# Patient Record
Sex: Female | Born: 1980
Health system: Southern US, Community
[De-identification: ages and names within clinical notes are randomized; demographics above are authoritative.]

## PROBLEM LIST (undated history)

## (undated) DIAGNOSIS — J189 Pneumonia, unspecified organism: Secondary | ICD-10-CM

## (undated) DIAGNOSIS — F4310 Post-traumatic stress disorder, unspecified: Secondary | ICD-10-CM

## (undated) DIAGNOSIS — Z7289 Other problems related to lifestyle: Secondary | ICD-10-CM

## (undated) DIAGNOSIS — M797 Fibromyalgia: Secondary | ICD-10-CM

## (undated) DIAGNOSIS — N809 Endometriosis, unspecified: Secondary | ICD-10-CM

## (undated) DIAGNOSIS — B059 Measles without complication: Secondary | ICD-10-CM

## (undated) DIAGNOSIS — D352 Benign neoplasm of pituitary gland: Secondary | ICD-10-CM

## (undated) DIAGNOSIS — B019 Varicella without complication: Secondary | ICD-10-CM

## (undated) DIAGNOSIS — F603 Borderline personality disorder: Secondary | ICD-10-CM

## (undated) DIAGNOSIS — F419 Anxiety disorder, unspecified: Secondary | ICD-10-CM

## (undated) HISTORY — PX: MULTIPLE TOOTH EXTRACTIONS: SHX2053

## (undated) HISTORY — PX: CHOLECYSTECTOMY: SHX55

## (undated) HISTORY — PX: CARPAL TUNNEL RELEASE: SHX101

## (undated) HISTORY — PX: TONSILLECTOMY: SUR1361

## (undated) HISTORY — PX: ULNAR NERVE REPAIR: SHX2594

---

## 2016-10-03 ENCOUNTER — Emergency Department (HOSPITAL_COMMUNITY): Payer: Self-pay

## 2016-10-03 ENCOUNTER — Inpatient Hospital Stay (HOSPITAL_COMMUNITY)
Admission: EM | Admit: 2016-10-03 | Discharge: 2016-10-06 | DRG: 439 | Discharge status: Against Medical Advice | Payer: Self-pay | Attending: Family Medicine | Admitting: Family Medicine

## 2016-10-03 ENCOUNTER — Encounter (HOSPITAL_COMMUNITY): Payer: Self-pay | Admitting: Emergency Medicine

## 2016-10-03 DIAGNOSIS — Z915 Personal history of self-harm: Secondary | ICD-10-CM

## 2016-10-03 DIAGNOSIS — F10920 Alcohol use, unspecified with intoxication, uncomplicated: Secondary | ICD-10-CM

## 2016-10-03 DIAGNOSIS — K860 Alcohol-induced chronic pancreatitis: Principal | ICD-10-CM | POA: Diagnosis present

## 2016-10-03 DIAGNOSIS — M797 Fibromyalgia: Secondary | ICD-10-CM | POA: Diagnosis present

## 2016-10-03 DIAGNOSIS — R748 Abnormal levels of other serum enzymes: Secondary | ICD-10-CM | POA: Diagnosis present

## 2016-10-03 DIAGNOSIS — F10129 Alcohol abuse with intoxication, unspecified: Secondary | ICD-10-CM

## 2016-10-03 DIAGNOSIS — K2920 Alcoholic gastritis without bleeding: Secondary | ICD-10-CM | POA: Diagnosis present

## 2016-10-03 DIAGNOSIS — F10221 Alcohol dependence with intoxication delirium: Secondary | ICD-10-CM | POA: Diagnosis present

## 2016-10-03 DIAGNOSIS — F1423 Cocaine dependence with withdrawal: Secondary | ICD-10-CM | POA: Diagnosis present

## 2016-10-03 DIAGNOSIS — F319 Bipolar disorder, unspecified: Secondary | ICD-10-CM | POA: Diagnosis present

## 2016-10-03 DIAGNOSIS — Z79890 Hormone replacement therapy (postmenopausal): Secondary | ICD-10-CM

## 2016-10-03 DIAGNOSIS — K852 Alcohol induced acute pancreatitis without necrosis or infection: Secondary | ICD-10-CM

## 2016-10-03 DIAGNOSIS — R45851 Suicidal ideations: Secondary | ICD-10-CM | POA: Diagnosis present

## 2016-10-03 DIAGNOSIS — F17210 Nicotine dependence, cigarettes, uncomplicated: Secondary | ICD-10-CM | POA: Diagnosis present

## 2016-10-03 DIAGNOSIS — R079 Chest pain, unspecified: Secondary | ICD-10-CM | POA: Diagnosis present

## 2016-10-03 DIAGNOSIS — F1110 Opioid abuse, uncomplicated: Secondary | ICD-10-CM | POA: Diagnosis present

## 2016-10-03 DIAGNOSIS — I10 Essential (primary) hypertension: Secondary | ICD-10-CM | POA: Diagnosis present

## 2016-10-03 DIAGNOSIS — F4310 Post-traumatic stress disorder, unspecified: Secondary | ICD-10-CM | POA: Diagnosis present

## 2016-10-03 DIAGNOSIS — G8929 Other chronic pain: Secondary | ICD-10-CM | POA: Diagnosis present

## 2016-10-03 DIAGNOSIS — F603 Borderline personality disorder: Secondary | ICD-10-CM | POA: Diagnosis present

## 2016-10-03 DIAGNOSIS — K5090 Crohn's disease, unspecified, without complications: Secondary | ICD-10-CM | POA: Diagnosis present

## 2016-10-03 DIAGNOSIS — Z91018 Allergy to other foods: Secondary | ICD-10-CM

## 2016-10-03 DIAGNOSIS — F410 Panic disorder [episodic paroxysmal anxiety] without agoraphobia: Secondary | ICD-10-CM | POA: Diagnosis present

## 2016-10-03 DIAGNOSIS — Y906 Blood alcohol level of 120-199 mg/100 ml: Secondary | ICD-10-CM | POA: Diagnosis present

## 2016-10-03 HISTORY — DX: Varicella without complication: B01.9

## 2016-10-03 HISTORY — DX: Anxiety disorder, unspecified: F41.9

## 2016-10-03 HISTORY — DX: Fibromyalgia: M79.7

## 2016-10-03 HISTORY — DX: Other problems related to lifestyle: Z72.89

## 2016-10-03 HISTORY — DX: Post-traumatic stress disorder, unspecified: F43.10

## 2016-10-03 HISTORY — DX: Borderline personality disorder: F60.3

## 2016-10-03 HISTORY — DX: Measles without complication: B05.9

## 2016-10-03 HISTORY — DX: Benign neoplasm of pituitary gland: D35.2

## 2016-10-03 HISTORY — DX: Endometriosis, unspecified: N80.9

## 2016-10-03 HISTORY — DX: Pneumonia, unspecified organism: J18.9

## 2016-10-03 LAB — COMPREHENSIVE METABOLIC PANEL
ALT: 33 U/L (ref 14–54)
AST: 45 U/L — AB (ref 15–41)
Albumin: 4.5 g/dL (ref 3.5–5.0)
Alkaline Phosphatase: 106 U/L (ref 38–126)
Anion gap: 11 (ref 5–15)
BILIRUBIN TOTAL: 0.7 mg/dL (ref 0.3–1.2)
CO2: 30 mmol/L (ref 22–32)
CREATININE: 0.64 mg/dL (ref 0.44–1.00)
Calcium: 9.2 mg/dL (ref 8.9–10.3)
Chloride: 104 mmol/L (ref 101–111)
GFR calc Af Amer: >60 mL/min (ref >60)
Glucose, Bld: 89 mg/dL (ref 65–99)
Potassium: 3.2 mmol/L — ABNORMAL LOW (ref 3.5–5.1)
Sodium: 145 mmol/L (ref 135–145)
TOTAL PROTEIN: 7.9 g/dL (ref 6.5–8.1)

## 2016-10-03 LAB — CBC WITH DIFFERENTIAL/PLATELET
BASOS ABS: 0 10*3/uL (ref 0.0–0.1)
Basophils Relative: 1 %
Eosinophils Absolute: 0.2 10*3/uL (ref 0.0–0.7)
Eosinophils Relative: 4 %
HEMATOCRIT: 38.4 % (ref 36.0–46.0)
HEMOGLOBIN: 13.9 g/dL (ref 12.0–15.0)
LYMPHS PCT: 31 %
Lymphs Abs: 1.5 10*3/uL (ref 0.7–4.0)
MCH: 32 pg (ref 26.0–34.0)
MCHC: 36.2 g/dL — ABNORMAL HIGH (ref 30.0–36.0)
MCV: 88.3 fL (ref 78.0–100.0)
MONO ABS: 0.3 10*3/uL (ref 0.1–1.0)
Monocytes Relative: 6 %
NEUTROS ABS: 2.8 10*3/uL (ref 1.7–7.7)
Neutrophils Relative %: 58 %
Platelets: 146 10*3/uL — ABNORMAL LOW (ref 150–400)
RBC: 4.35 MIL/uL (ref 3.87–5.11)
RDW: 13.1 % (ref 11.5–15.5)
WBC: 4.8 10*3/uL (ref 4.0–10.5)

## 2016-10-03 LAB — I-STAT BETA HCG BLOOD, ED (MC, WL, AP ONLY)

## 2016-10-03 LAB — RAPID URINE DRUG SCREEN, HOSP PERFORMED
Amphetamines: NOT DETECTED
BENZODIAZEPINES: POSITIVE — AB
Barbiturates: NOT DETECTED
Cocaine: POSITIVE — AB
OPIATES: POSITIVE — AB
TETRAHYDROCANNABINOL: NOT DETECTED

## 2016-10-03 LAB — URINALYSIS, ROUTINE W REFLEX MICROSCOPIC
BILIRUBIN URINE: NEGATIVE
Glucose, UA: NEGATIVE mg/dL
HGB URINE DIPSTICK: NEGATIVE
Ketones, ur: NEGATIVE mg/dL
Leukocytes, UA: NEGATIVE
NITRITE: NEGATIVE
Protein, ur: NEGATIVE mg/dL
SPECIFIC GRAVITY, URINE: 1.003 — AB (ref 1.005–1.030)
pH: 7 (ref 5.0–8.0)

## 2016-10-03 LAB — TROPONIN I

## 2016-10-03 LAB — LIPASE, BLOOD: Lipase: 76 U/L — ABNORMAL HIGH (ref 11–51)

## 2016-10-03 LAB — ETHANOL: Alcohol, Ethyl (B): 166 mg/dL — ABNORMAL HIGH (ref <5)

## 2016-10-03 MED ORDER — IOPAMIDOL (ISOVUE-300) INJECTION 61%
30.0000 mL | Freq: Once | INTRAVENOUS | Status: AC | PRN
  Administered 2016-10-03: 30 mL via ORAL

## 2016-10-03 MED ORDER — SODIUM CHLORIDE 0.9 % IV SOLN
INTRAVENOUS | Status: DC
  Administered 2016-10-03 – 2016-10-04 (×3): via INTRAVENOUS

## 2016-10-03 MED ORDER — LORAZEPAM 1 MG PO TABS
1.0000 mg | ORAL_TABLET | ORAL | Status: DC | PRN
Start: 2016-10-04 — End: 2016-10-06
  Administered 2016-10-04 – 2016-10-05 (×5): 1 mg via ORAL
  Filled 2016-10-03 (×5): qty 1

## 2016-10-03 MED ORDER — SODIUM CHLORIDE 0.9% FLUSH
3.0000 mL | Freq: Two times a day (BID) | INTRAVENOUS | Status: DC
  Administered 2016-10-03 – 2016-10-06 (×4): 3 mL via INTRAVENOUS

## 2016-10-03 MED ORDER — LORAZEPAM 2 MG/ML IJ SOLN
1.0000 mg | Freq: Four times a day (QID) | INTRAMUSCULAR | Status: DC | PRN
  Administered 2016-10-03: 1 mg via INTRAVENOUS
  Filled 2016-10-03 (×2): qty 1

## 2016-10-03 MED ORDER — IOPAMIDOL (ISOVUE-300) INJECTION 61%
INTRAVENOUS | Status: AC
  Administered 2016-10-03: 30 mL via ORAL
  Filled 2016-10-03: qty 30

## 2016-10-03 MED ORDER — PANTOPRAZOLE SODIUM 40 MG IV SOLR
40.0000 mg | Freq: Two times a day (BID) | INTRAVENOUS | Status: DC
  Administered 2016-10-03 – 2016-10-04 (×2): 40 mg via INTRAVENOUS
  Filled 2016-10-03 (×2): qty 40

## 2016-10-03 MED ORDER — HALOPERIDOL LACTATE 5 MG/ML IJ SOLN
2.0000 mg | Freq: Once | INTRAMUSCULAR | Status: DC
Start: 2016-10-03 — End: 2016-10-03
  Filled 2016-10-03: qty 1

## 2016-10-03 MED ORDER — LORAZEPAM 2 MG/ML IJ SOLN
1.0000 mg | Freq: Once | INTRAMUSCULAR | Status: AC
  Administered 2016-10-03: 1 mg via INTRAVENOUS
  Filled 2016-10-03: qty 1

## 2016-10-03 MED ORDER — DIPHENHYDRAMINE HCL 50 MG/ML IJ SOLN
12.5000 mg | Freq: Once | INTRAMUSCULAR | Status: AC
  Administered 2016-10-03: 12.5 mg via INTRAVENOUS
  Filled 2016-10-03: qty 1

## 2016-10-03 MED ORDER — LORAZEPAM 2 MG/ML IJ SOLN
1.0000 mg | INTRAMUSCULAR | Status: DC | PRN
  Administered 2016-10-03 – 2016-10-06 (×9): 1 mg via INTRAVENOUS
  Filled 2016-10-03 (×9): qty 1

## 2016-10-03 MED ORDER — SODIUM CHLORIDE 0.9 % IV BOLUS (SEPSIS)
1000.0000 mL | Freq: Once | INTRAVENOUS | Status: AC
  Administered 2016-10-03: 1000 mL via INTRAVENOUS

## 2016-10-03 MED ORDER — MORPHINE SULFATE (PF) 2 MG/ML IV SOLN
4.0000 mg | Freq: Once | INTRAVENOUS | Status: AC
  Administered 2016-10-03: 4 mg via INTRAVENOUS
  Filled 2016-10-03: qty 2

## 2016-10-03 MED ORDER — KETOROLAC TROMETHAMINE 30 MG/ML IJ SOLN
30.0000 mg | Freq: Once | INTRAMUSCULAR | Status: AC
  Administered 2016-10-03: 30 mg via INTRAVENOUS
  Filled 2016-10-03: qty 1

## 2016-10-03 MED ORDER — LORAZEPAM 2 MG/ML IJ SOLN: 1.0000 mg | Freq: Four times a day (QID) | INTRAMUSCULAR | Status: DC | PRN

## 2016-10-03 MED ORDER — LORAZEPAM 1 MG PO TABS: 1.0000 mg | ORAL_TABLET | ORAL | Status: DC | PRN

## 2016-10-03 MED ORDER — METOCLOPRAMIDE HCL 5 MG/ML IJ SOLN
10.0000 mg | Freq: Once | INTRAMUSCULAR | Status: DC
  Filled 2016-10-03: qty 2

## 2016-10-03 MED ORDER — FOLIC ACID 1 MG PO TABS
1.0000 mg | ORAL_TABLET | Freq: Every day | ORAL | Status: DC
  Administered 2016-10-04 – 2016-10-06 (×3): 1 mg via ORAL
  Filled 2016-10-03 (×3): qty 1

## 2016-10-03 MED ORDER — IOPAMIDOL (ISOVUE-300) INJECTION 61%
INTRAVENOUS | Status: AC
  Filled 2016-10-03: qty 100

## 2016-10-03 MED ORDER — ADULT MULTIVITAMIN W/MINERALS CH
1.0000 | ORAL_TABLET | Freq: Every day | ORAL | Status: DC
  Administered 2016-10-04 – 2016-10-06 (×3): 1 via ORAL
  Filled 2016-10-03 (×3): qty 1

## 2016-10-03 MED ORDER — THIAMINE HCL 100 MG/ML IJ SOLN: 100.0000 mg | Freq: Every day | INTRAMUSCULAR | Status: DC

## 2016-10-03 MED ORDER — MORPHINE SULFATE (PF) 2 MG/ML IV SOLN
4.0000 mg | INTRAVENOUS | Status: DC | PRN
  Administered 2016-10-03 – 2016-10-04 (×4): 4 mg via INTRAVENOUS
  Filled 2016-10-03 (×4): qty 2

## 2016-10-03 MED ORDER — LORAZEPAM 1 MG PO TABS: 1.0000 mg | ORAL_TABLET | Freq: Four times a day (QID) | ORAL | Status: DC | PRN

## 2016-10-03 MED ORDER — MAGNESIUM SULFATE 2 GM/50ML IV SOLN
2.0000 g | Freq: Once | INTRAVENOUS | Status: AC
  Administered 2016-10-03: 2 g via INTRAVENOUS
  Filled 2016-10-03: qty 50

## 2016-10-03 MED ORDER — POTASSIUM CHLORIDE CRYS ER 20 MEQ PO TBCR
40.0000 meq | EXTENDED_RELEASE_TABLET | Freq: Once | ORAL | Status: AC
  Administered 2016-10-03: 40 meq via ORAL
  Filled 2016-10-03: qty 2

## 2016-10-03 MED ORDER — ONDANSETRON HCL 4 MG PO TABS
4.0000 mg | ORAL_TABLET | Freq: Four times a day (QID) | ORAL | Status: DC | PRN
  Administered 2016-10-04 – 2016-10-05 (×2): 4 mg via ORAL
  Filled 2016-10-03 (×2): qty 1

## 2016-10-03 MED ORDER — VITAMIN B-1 100 MG PO TABS
100.0000 mg | ORAL_TABLET | Freq: Every day | ORAL | Status: DC
  Administered 2016-10-04 – 2016-10-06 (×3): 100 mg via ORAL
  Filled 2016-10-03 (×3): qty 1

## 2016-10-03 MED ORDER — CHLORDIAZEPOXIDE HCL 10 MG PO CAPS
10.0000 mg | ORAL_CAPSULE | Freq: Three times a day (TID) | ORAL | Status: DC
  Administered 2016-10-03 – 2016-10-04 (×2): 10 mg via ORAL
  Filled 2016-10-03: qty 2
  Filled 2016-10-03 (×2): qty 1

## 2016-10-03 MED ORDER — ENOXAPARIN SODIUM 40 MG/0.4ML ~~LOC~~ SOLN
40.0000 mg | SUBCUTANEOUS | Status: DC
  Administered 2016-10-03 – 2016-10-05 (×3): 40 mg via SUBCUTANEOUS
  Filled 2016-10-03 (×3): qty 0.4

## 2016-10-03 MED ORDER — IOPAMIDOL (ISOVUE-300) INJECTION 61%
100.0000 mL | Freq: Once | INTRAVENOUS | Status: AC | PRN
  Administered 2016-10-03: 100 mL via INTRAVENOUS

## 2016-10-03 MED ORDER — ONDANSETRON HCL 4 MG/2ML IJ SOLN
4.0000 mg | Freq: Four times a day (QID) | INTRAMUSCULAR | Status: DC | PRN
  Administered 2016-10-05 – 2016-10-06 (×2): 4 mg via INTRAVENOUS
  Filled 2016-10-03 (×2): qty 2

## 2016-10-03 MED ORDER — THIAMINE HCL 100 MG/ML IJ SOLN
Freq: Once | INTRAVENOUS | Status: AC
  Administered 2016-10-03: 17:00:00 via INTRAVENOUS
  Filled 2016-10-03: qty 1000

## 2016-10-03 NOTE — ED Triage Notes (Signed)
Patient is complaining of abdominal pain associated with N/V. Patient has a history of pancreatitis.  Patient is from [place].       BP:  133/101 HR:110 R:20  CBG: 128

## 2016-10-03 NOTE — ED Notes (Signed)
Pt reminded we need a urine sample. Unable to provide at this time.  Pt also provided with a phone because she states she wants to call someone to come get her so she can get some alcohol.
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
## 2016-10-03 NOTE — ED Notes (Signed)
Bed: WA08 Expected date:  Expected time:  Means of arrival:  Comments: EMS n/v

## 2016-10-03 NOTE — ED Notes (Signed)
Upon walking into the room, pt immediately started yelling, saying she's "being discriminated against" because she has not received "opiate pain medicine". Pt states that her "tolerance is very high and she needs a higher dose" & that if she is not going to be given pain medicine, she will leave to go get alcohol and drink the pain away.   RN made MD aware. RN also tried to explain that she is not being discriminated against & that reglan was offered to her earlier, which she refused. RN also explained that the fluids she's receiving are part of the treatment.
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
## 2016-10-04 DIAGNOSIS — F17210 Nicotine dependence, cigarettes, uncomplicated: Secondary | ICD-10-CM

## 2016-10-04 DIAGNOSIS — F1120 Opioid dependence, uncomplicated: Secondary | ICD-10-CM

## 2016-10-04 DIAGNOSIS — K859 Acute pancreatitis without necrosis or infection, unspecified: Secondary | ICD-10-CM

## 2016-10-04 DIAGNOSIS — F1410 Cocaine abuse, uncomplicated: Secondary | ICD-10-CM

## 2016-10-04 DIAGNOSIS — K2920 Alcoholic gastritis without bleeding: Secondary | ICD-10-CM

## 2016-10-04 DIAGNOSIS — F603 Borderline personality disorder: Secondary | ICD-10-CM

## 2016-10-04 DIAGNOSIS — F1920 Other psychoactive substance dependence, uncomplicated: Secondary | ICD-10-CM

## 2016-10-04 LAB — COMPREHENSIVE METABOLIC PANEL
ALK PHOS: 69 U/L (ref 38–126)
ALT: 23 U/L (ref 14–54)
AST: 31 U/L (ref 15–41)
Albumin: 3.2 g/dL — ABNORMAL LOW (ref 3.5–5.0)
Anion gap: 5 (ref 5–15)
BILIRUBIN TOTAL: 1.1 mg/dL (ref 0.3–1.2)
BUN: <5 mg/dL — ABNORMAL LOW (ref 6–20)
CALCIUM: 7.8 mg/dL — AB (ref 8.9–10.3)
CO2: 25 mmol/L (ref 22–32)
Chloride: 108 mmol/L (ref 101–111)
Creatinine, Ser: 0.63 mg/dL (ref 0.44–1.00)
GLUCOSE: 83 mg/dL (ref 65–99)
POTASSIUM: 3.6 mmol/L (ref 3.5–5.1)
Sodium: 138 mmol/L (ref 135–145)
TOTAL PROTEIN: 5.7 g/dL — AB (ref 6.5–8.1)

## 2016-10-04 LAB — CBC
HCT: 29.2 % — ABNORMAL LOW (ref 36.0–46.0)
HEMOGLOBIN: 10.4 g/dL — AB (ref 12.0–15.0)
MCH: 32.4 pg (ref 26.0–34.0)
MCHC: 35.6 g/dL (ref 30.0–36.0)
MCV: 91 fL (ref 78.0–100.0)
PLATELETS: 91 10*3/uL — AB (ref 150–400)
RBC: 3.21 MIL/uL — AB (ref 3.87–5.11)
RDW: 13.4 % (ref 11.5–15.5)
WBC: 4.5 10*3/uL (ref 4.0–10.5)

## 2016-10-04 LAB — TROPONIN I

## 2016-10-04 LAB — HIV ANTIBODY (ROUTINE TESTING W REFLEX): HIV SCREEN 4TH GENERATION: NONREACTIVE

## 2016-10-04 MED ORDER — CHLORDIAZEPOXIDE HCL 25 MG PO CAPS
50.0000 mg | ORAL_CAPSULE | Freq: Three times a day (TID) | ORAL | Status: AC
  Administered 2016-10-04 – 2016-10-05 (×3): 50 mg via ORAL
  Filled 2016-10-04 (×3): qty 2

## 2016-10-04 MED ORDER — GABAPENTIN 100 MG PO CAPS
100.0000 mg | ORAL_CAPSULE | Freq: Two times a day (BID) | ORAL | Status: DC
  Administered 2016-10-04 – 2016-10-06 (×4): 100 mg via ORAL
  Filled 2016-10-04 (×4): qty 1

## 2016-10-04 MED ORDER — FAMOTIDINE IN NACL 20-0.9 MG/50ML-% IV SOLN
20.0000 mg | Freq: Two times a day (BID) | INTRAVENOUS | Status: DC
  Administered 2016-10-04 – 2016-10-05 (×2): 20 mg via INTRAVENOUS
  Filled 2016-10-04 (×2): qty 50

## 2016-10-04 MED ORDER — OXYCODONE-ACETAMINOPHEN 5-325 MG PO TABS
2.0000 | ORAL_TABLET | Freq: Four times a day (QID) | ORAL | Status: DC | PRN
  Administered 2016-10-04 – 2016-10-05 (×4): 2 via ORAL
  Filled 2016-10-04 (×4): qty 2

## 2016-10-04 MED ORDER — CHLORDIAZEPOXIDE HCL 25 MG PO CAPS
25.0000 mg | ORAL_CAPSULE | Freq: Four times a day (QID) | ORAL | Status: DC
  Administered 2016-10-05 – 2016-10-06 (×4): 25 mg via ORAL
  Filled 2016-10-04 (×4): qty 1

## 2016-10-04 MED ORDER — MORPHINE SULFATE (PF) 2 MG/ML IV SOLN
4.0000 mg | INTRAVENOUS | Status: DC | PRN
  Administered 2016-10-04 (×2): 4 mg via INTRAVENOUS
  Filled 2016-10-04 (×2): qty 2
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
## 2016-10-05 ENCOUNTER — Encounter (HOSPITAL_COMMUNITY): Payer: Self-pay

## 2016-10-05 LAB — CBC WITH DIFFERENTIAL/PLATELET
BASOS ABS: 0 10*3/uL (ref 0.0–0.1)
BASOS PCT: 1 %
EOS PCT: 3 %
Eosinophils Absolute: 0.1 10*3/uL (ref 0.0–0.7)
HCT: 31.8 % — ABNORMAL LOW (ref 36.0–46.0)
Hemoglobin: 11.1 g/dL — ABNORMAL LOW (ref 12.0–15.0)
LYMPHS PCT: 42 %
Lymphs Abs: 1.6 10*3/uL (ref 0.7–4.0)
MCH: 32.5 pg (ref 26.0–34.0)
MCHC: 34.9 g/dL (ref 30.0–36.0)
MCV: 93 fL (ref 78.0–100.0)
MONO ABS: 0.4 10*3/uL (ref 0.1–1.0)
MONOS PCT: 9 %
Neutro Abs: 1.8 10*3/uL (ref 1.7–7.7)
Neutrophils Relative %: 45 %
PLATELETS: 126 10*3/uL — AB (ref 150–400)
RBC: 3.42 MIL/uL — ABNORMAL LOW (ref 3.87–5.11)
RDW: 13.5 % (ref 11.5–15.5)
WBC: 3.9 10*3/uL — ABNORMAL LOW (ref 4.0–10.5)

## 2016-10-05 LAB — BASIC METABOLIC PANEL
ANION GAP: 7 (ref 5–15)
CHLORIDE: 110 mmol/L (ref 101–111)
CO2: 23 mmol/L (ref 22–32)
Calcium: 8.7 mg/dL — ABNORMAL LOW (ref 8.9–10.3)
Creatinine, Ser: 0.62 mg/dL (ref 0.44–1.00)
Glucose, Bld: 94 mg/dL (ref 65–99)
POTASSIUM: 4.1 mmol/L (ref 3.5–5.1)
Sodium: 140 mmol/L (ref 135–145)

## 2016-10-05 LAB — LIPASE, BLOOD: LIPASE: 79 U/L — AB (ref 11–51)

## 2016-10-05 MED ORDER — LACTATED RINGERS IV SOLN
INTRAVENOUS | Status: DC
  Administered 2016-10-05 – 2016-10-06 (×2): via INTRAVENOUS

## 2016-10-05 MED ORDER — FAMOTIDINE 20 MG PO TABS
20.0000 mg | ORAL_TABLET | Freq: Two times a day (BID) | ORAL | Status: DC
  Administered 2016-10-05 – 2016-10-06 (×2): 20 mg via ORAL
  Filled 2016-10-05 (×2): qty 1

## 2016-10-05 MED ORDER — MORPHINE SULFATE (PF) 2 MG/ML IV SOLN
2.0000 mg | INTRAVENOUS | Status: DC | PRN
  Administered 2016-10-05 – 2016-10-06 (×6): 2 mg via INTRAVENOUS
  Filled 2016-10-05 (×6): qty 1
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
## 2016-10-05 NOTE — Progress Notes (Signed)
PHARMACIST - PHYSICIAN COMMUNICATION  DR:   Quincy Simmonds  CONCERNING: IV to Oral Route Change Policy  RECOMMENDATION: This patient is receiving Pepcid by the intravenous route.  Based on criteria approved by the Pharmacy and Therapeutics Committee, the intravenous medication(s) is/are being converted to the equivalent oral dose form(s).   DESCRIPTION: These criteria include:  The patient is eating (either orally or via tube) and/or has been taking other orally administered medications for a least 24 hours  The patient has no evidence of active gastrointestinal bleeding or impaired GI absorption (gastrectomy, short bowel, patient on TNA or NPO).  If you have questions about this conversion, please contact the Pharmacy Department  []   [phone] )  [facility] []   [phone] )  [facility] []   [phone] )  [facility] []   [phone] )  [facility] [x]   [phone] )  [place], [place], [facility] 10/05/2016 11:13 AM

## 2016-10-05 NOTE — Progress Notes (Signed)
Initial Nutrition Assessment  INTERVENTION:   Diet advancement per MD Once diet advanced: Provide Ensure Enlive po BID, each supplement provides 350 kcal and 20 grams of protein RD to continue to monitor  NUTRITION DIAGNOSIS:   Increased nutrient needs related to other (see comment) (acute pancreatitis) as evidenced by estimated needs.  GOAL:   Patient will meet greater than or equal to 90% of their needs  MONITOR:   PO intake, Supplement acceptance, Labs, Weight trends, I & O's  REASON FOR ASSESSMENT:   Malnutrition Screening Tool    ASSESSMENT:   36 y.o. female with medical history significant of alcohol abuse, cocaine tobacco abuse, presents to the emergency room with chief complaint of abdominal pain, nausea vomiting over the last 4-5 days.  Patient tells me that she has had an episode like this last week, she was diagnosed with acute pancreatitis and was hospitalized in [place].  She is currently visiting [place] with a friend.  She also tells me that she has been drinking heavily over the last few days, and last night she had a what sounds like a 1.75 L of vodka, and in addition 6 beers.  She states that she has a history of seizures in the setting of alcohol withdrawals and severe DTs.  She does not recall needing to be in ICU for her withdrawals.  She also is complaining of chest pain, and she states that over the last few months to year she has been having intermittent chest pain as well as chest pains and dyspnea on exertion with ambulation.  She states that when she was hospitalized in [place] she had a one-time fever, however has been afebrile since denies any chills.  She complains of significant nausea and vomiting and poor ability to take any p.o. intake, has not had anything to eat in the last 5 days.   Pt in room sleeping, RD unable to waken patient at this time. Per chart review, pt with history of polysubstance abuse (ETOH, cocaine). Pt reported  not eating much for 5 days PTA d/t N/V. Pt currently NPO.  Once diet advanced, will provide Ensure supplements d/t increased needs.  No weigh history available in EPIC. Nutrition focused physical exam shows no sign of depletion of muscle mass or body fat.  Labs reviewed. Medications: Folic acid tablet daily, Multivitamin with minerals daily, Thiamine tablet daily, Lactated ringers infusion, Zofran tablet PRN  Diet Order:  Diet NPO time specified  Skin:  Reviewed, no issues  Last BM:  4/6  Height:   Ht Readings from Last 1 Encounters:  10/03/16 5\' 1"  (1.549 m)    Weight:   Wt Readings from Last 1 Encounters:  10/03/16 110 lb (49.9 kg)    Ideal Body Weight:  47.7 kg  BMI:  Body mass index is 20.78 kg/m.  Estimated Nutritional Needs:   Kcal:  1500-1700  Protein:  80-90g  Fluid:  1.7L/day  EDUCATION NEEDS:   No education needs identified at this time  Clayton Bibles, MS, RD, LDN Pager: [phone] After Hours Pager: [phone]

## 2016-10-06 LAB — CBC
HEMATOCRIT: 33.1 % — AB (ref 36.0–46.0)
Hemoglobin: 11.7 g/dL — ABNORMAL LOW (ref 12.0–15.0)
MCH: 32.5 pg (ref 26.0–34.0)
MCHC: 35.3 g/dL (ref 30.0–36.0)
MCV: 91.9 fL (ref 78.0–100.0)
PLATELETS: 136 10*3/uL — AB (ref 150–400)
RBC: 3.6 MIL/uL — ABNORMAL LOW (ref 3.87–5.11)
RDW: 13.4 % (ref 11.5–15.5)
WBC: 3.8 10*3/uL — AB (ref 4.0–10.5)

## 2016-10-06 LAB — LIPASE, BLOOD: LIPASE: 32 U/L (ref 11–51)

## 2016-10-06 MED ORDER — KETOROLAC TROMETHAMINE 15 MG/ML IJ SOLN: 15.0000 mg | Freq: Four times a day (QID) | INTRAMUSCULAR | Status: DC | PRN

## 2016-10-06 MED ORDER — ONDANSETRON 4 MG PO TBDP: 4.0000 mg | ORAL_TABLET | Freq: Three times a day (TID) | ORAL | 0 refills | Status: DC | PRN

## 2016-10-06 MED ORDER — ONDANSETRON 4 MG PO TBDP: 4.0000 mg | ORAL_TABLET | Freq: Three times a day (TID) | ORAL | Status: DC | PRN

## 2016-10-06 MED ORDER — FAMOTIDINE 20 MG PO TABS: 20.0000 mg | ORAL_TABLET | Freq: Two times a day (BID) | ORAL | 0 refills | Status: DC

## 2016-10-06 MED ORDER — PAROXETINE HCL 20 MG PO TABS: 20.0000 mg | ORAL_TABLET | Freq: Every day | ORAL | 0 refills | Status: DC

## 2016-10-06 MED ORDER — THIAMINE HCL 100 MG PO TABS: 100.0000 mg | ORAL_TABLET | Freq: Every day | ORAL | 0 refills | Status: DC

## 2016-10-06 MED ORDER — PAROXETINE HCL 20 MG PO TABS
20.0000 mg | ORAL_TABLET | Freq: Every day | ORAL | Status: DC
  Filled 2016-10-06: qty 1

## 2016-10-06 MED ORDER — FOLIC ACID 1 MG PO TABS: 1.0000 mg | ORAL_TABLET | Freq: Every day | ORAL | 0 refills | Status: DC

## 2016-10-06 MED ORDER — MORPHINE SULFATE (PF) 4 MG/ML IV SOLN
2.0000 mg | INTRAVENOUS | Status: DC | PRN
  Administered 2016-10-06: 2 mg via INTRAVENOUS
  Filled 2016-10-06: qty 1

## 2016-10-06 NOTE — Progress Notes (Signed)
Patient agitated and irritable, refusing to wait on D/C paperwork. requesting to leave AMA. Form signed and patient escorted to elevator.

## 2016-10-06 NOTE — Progress Notes (Signed)
Chaplain visited patient while rounding.  Patient sitting in bed, crying and emoting regarding how she feels she is being treated by staff stating "they are treating me based on my past".  Patient talks about relationship with daughter, brothers and mother and expressing being worried about the outcome of her daughter today.  Patient expressing fear around daughter possibly being picked up by DSS if she doesn't get some paperwork signed.  Patient continues to express anger around pain, not receiving right protocol from staff and not receiving enough pain meds based on her knowledge of how she should be treated from web searches.   Patient states she believes in [facility] but that doesn't seem to help.  Patient does thank Chaplain for visit.    Chaplain will remain available if needed.    10/06/16 1058  Clinical Encounter Type  Visited With Patient  Visit Type Initial;Psychological support;Spiritual support;Social support;Behavioral Health  Spiritual Encounters  Spiritual Needs Emotional  Stress Factors  Patient Stress Factors Exhausted;Family relationships;Financial concerns;Health changes   Berneice Heinrich

## 2016-10-06 NOTE — Care Management Note (Signed)
Case Management Note  Patient Details  Name: Carrie Phillips MRN: [id number] Date of Birth: 10-04-1980  Subjective/Objective:  36 y/o f admitted w/polysubstance abuse. From home. Psych-providing counseling.                  Action/Plan:d/c plan home.   Expected Discharge Date:   (unknown)               Expected Discharge Plan:  Home/Self Care  In-House Referral:  Clinical Social Work  Discharge planning Services  CM Consult  Post Acute Care Choice:    Choice offered to:     DME Arranged:    DME Agency:     HH Arranged:    HH Agency:     Status of Service:  In process, will continue to follow  If discussed at Long Length of Stay Meetings, dates discussed:    Additional Comments:  Dessa Phi, RN 10/06/2016, 11:28 AM
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
## 2016-10-08 ENCOUNTER — Encounter (HOSPITAL_COMMUNITY): Payer: Self-pay | Admitting: Emergency Medicine

## 2016-10-08 ENCOUNTER — Emergency Department (HOSPITAL_COMMUNITY)
Admission: EM | Admit: 2016-10-08 | Discharge: 2016-10-08 | Disposition: A | Discharge status: Home / Self Care | Payer: Self-pay | Attending: Emergency Medicine | Admitting: Emergency Medicine

## 2016-10-08 DIAGNOSIS — F17210 Nicotine dependence, cigarettes, uncomplicated: Secondary | ICD-10-CM | POA: Insufficient documentation

## 2016-10-08 DIAGNOSIS — Z8719 Personal history of other diseases of the digestive system: Secondary | ICD-10-CM | POA: Insufficient documentation

## 2016-10-08 DIAGNOSIS — F1020 Alcohol dependence, uncomplicated: Secondary | ICD-10-CM | POA: Insufficient documentation

## 2016-10-08 LAB — COMPREHENSIVE METABOLIC PANEL
ALK PHOS: 79 U/L (ref 38–126)
ALT: 19 U/L (ref 14–54)
ANION GAP: 11 (ref 5–15)
AST: 25 U/L (ref 15–41)
Albumin: 4.1 g/dL (ref 3.5–5.0)
BUN: <5 mg/dL — ABNORMAL LOW (ref 6–20)
CALCIUM: 9.2 mg/dL (ref 8.9–10.3)
CO2: 26 mmol/L (ref 22–32)
Chloride: 103 mmol/L (ref 101–111)
Creatinine, Ser: 0.61 mg/dL (ref 0.44–1.00)
GFR calc non Af Amer: >60 mL/min (ref >60)
Glucose, Bld: 106 mg/dL — ABNORMAL HIGH (ref 65–99)
POTASSIUM: 3.6 mmol/L (ref 3.5–5.1)
SODIUM: 140 mmol/L (ref 135–145)
Total Bilirubin: 0.3 mg/dL (ref 0.3–1.2)
Total Protein: 6.9 g/dL (ref 6.5–8.1)

## 2016-10-08 LAB — CBC WITH DIFFERENTIAL/PLATELET
BASOS PCT: 0 %
Basophils Absolute: 0 10*3/uL (ref 0.0–0.1)
EOS ABS: 0 10*3/uL (ref 0.0–0.7)
EOS PCT: 1 %
HCT: 39.5 % (ref 36.0–46.0)
HEMOGLOBIN: 14.1 g/dL (ref 12.0–15.0)
LYMPHS ABS: 1.4 10*3/uL (ref 0.7–4.0)
LYMPHS PCT: 25 %
MCH: 32.6 pg (ref 26.0–34.0)
MCHC: 35.7 g/dL (ref 30.0–36.0)
MCV: 91.2 fL (ref 78.0–100.0)
MONO ABS: 0.4 10*3/uL (ref 0.1–1.0)
MONOS PCT: 7 %
NEUTROS ABS: 3.8 10*3/uL (ref 1.7–7.7)
NEUTROS PCT: 67 %
PLATELETS: 201 10*3/uL (ref 150–400)
RBC: 4.33 MIL/uL (ref 3.87–5.11)
RDW: 14.5 % (ref 11.5–15.5)
WBC: 5.6 10*3/uL (ref 4.0–10.5)

## 2016-10-08 LAB — ETHANOL

## 2016-10-08 LAB — LIPASE, BLOOD: Lipase: 45 U/L (ref 11–51)

## 2016-10-08 MED ORDER — LORAZEPAM 1 MG PO TABS
0.0000 mg | ORAL_TABLET | Freq: Four times a day (QID) | ORAL | Status: DC
Start: 2016-10-08 — End: 2016-10-08
  Administered 2016-10-08 (×2): 2 mg via ORAL
  Filled 2016-10-08 (×2): qty 2

## 2016-10-08 MED ORDER — ZOLPIDEM TARTRATE 5 MG PO TABS: 5.0000 mg | ORAL_TABLET | Freq: Every evening | ORAL | Status: DC | PRN

## 2016-10-08 MED ORDER — VITAMIN B-1 100 MG PO TABS
100.0000 mg | ORAL_TABLET | Freq: Every day | ORAL | Status: DC
  Administered 2016-10-08: 100 mg via ORAL
  Filled 2016-10-08: qty 1

## 2016-10-08 MED ORDER — LORAZEPAM 1 MG PO TABS: 0.0000 mg | ORAL_TABLET | Freq: Two times a day (BID) | ORAL | Status: DC

## 2016-10-08 MED ORDER — CHLORDIAZEPOXIDE HCL 25 MG PO CAPS: ORAL_CAPSULE | ORAL | 0 refills | Status: DC

## 2016-10-08 MED ORDER — OXYCODONE-ACETAMINOPHEN 5-325 MG PO TABS
2.0000 | ORAL_TABLET | Freq: Once | ORAL | Status: AC
  Administered 2016-10-08: 2 via ORAL
  Filled 2016-10-08: qty 2

## 2016-10-08 MED ORDER — NICOTINE 21 MG/24HR TD PT24
21.0000 mg | MEDICATED_PATCH | Freq: Every day | TRANSDERMAL | Status: DC | PRN
  Filled 2016-10-08: qty 1

## 2016-10-08 MED ORDER — THIAMINE HCL 100 MG/ML IJ SOLN: 100.0000 mg | Freq: Every day | INTRAMUSCULAR | Status: DC

## 2016-10-08 MED ORDER — ONDANSETRON 4 MG PO TBDP: 4.0000 mg | ORAL_TABLET | Freq: Three times a day (TID) | ORAL | 0 refills | Status: DC | PRN

## 2016-10-08 MED ORDER — OXYCODONE-ACETAMINOPHEN 5-325 MG PO TABS: 1.0000 | ORAL_TABLET | ORAL | 0 refills | Status: DC | PRN

## 2016-10-08 MED ORDER — OXYCODONE-ACETAMINOPHEN 5-325 MG PO TABS
1.0000 | ORAL_TABLET | Freq: Once | ORAL | Status: AC
  Administered 2016-10-08: 1 via ORAL
  Filled 2016-10-08: qty 1

## 2016-10-08 MED ORDER — ALUM & MAG HYDROXIDE-SIMETH 200-200-20 MG/5ML PO SUSP
30.0000 mL | ORAL | Status: DC | PRN
  Administered 2016-10-08: 30 mL via ORAL
  Filled 2016-10-08: qty 30

## 2016-10-08 MED ORDER — OXYCODONE-ACETAMINOPHEN 5-325 MG PO TABS: 1.0000 | ORAL_TABLET | Freq: Four times a day (QID) | ORAL | 0 refills | Status: DC | PRN

## 2016-10-08 MED ORDER — ONDANSETRON HCL 4 MG PO TABS
4.0000 mg | ORAL_TABLET | Freq: Three times a day (TID) | ORAL | Status: DC | PRN
  Administered 2016-10-08: 4 mg via ORAL
  Filled 2016-10-08: qty 1

## 2016-10-08 MED ORDER — IBUPROFEN 400 MG PO TABS
600.0000 mg | ORAL_TABLET | Freq: Three times a day (TID) | ORAL | Status: DC | PRN
  Administered 2016-10-08: 600 mg via ORAL
  Filled 2016-10-08: qty 1

## 2016-10-08 MED ORDER — PROMETHAZINE HCL 25 MG PO TABS
25.0000 mg | ORAL_TABLET | Freq: Once | ORAL | Status: AC
  Administered 2016-10-08: 25 mg via ORAL
  Filled 2016-10-08: qty 1

## 2016-10-08 NOTE — ED Provider Notes (Signed)
17:04- Patient interviewed. She is concerned about ongoing pancreatitis pain. She has been detoxed while IP, twice in last 2 weeks. She is worried about continuing to drink,''  Assessment-patient with anxiety, and alcoholism.  No apparent or impending significant alcohol withdrawal syndrome.  Is therefore stable for discharge with outpatient management.  Plan-Librium prescription to go     Daleen Bo, MD 10/11/16 2206

## 2016-10-08 NOTE — ED Notes (Signed)
Pt doing TTS. Is requesting pain meds for abdominal pain r/t hx of pancreatitis. Dr. Gilford Raid report she can have tylenol or motrin. Pt does not want either of those right now.

## 2016-10-08 NOTE — ED Notes (Signed)
Pt denied by [facility] and per [facility] is recommended for discharge. Notified dr Eulis Foster. Pt is awake and eating lunch.

## 2016-10-08 NOTE — ED Notes (Signed)
TTS called, reports they are ready for her assessment. Machine is in use then will take to her room.

## 2016-10-08 NOTE — ED Notes (Signed)
In to discharge patient, when she found out that she will not be sent to [facility] she reports she is suicidal and is going to hurt herself. "If I go home, I'm going to have to buy alcohol and will kill myself". PA made aware and went to bedside to talk with patient. PA reports [facility] will come talk with patient.
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
## 2016-10-08 NOTE — ED Notes (Signed)
Pt asking about detox services.  Pt sts she is concerned about having alcohol withdrawal seizures and is afraid to leave the hospital, stating she will start drinking again if she seizes, and she's trying to quit.

## 2016-10-08 NOTE — ED Notes (Signed)
TTS called and reports that she does not meet criteria for inpatient admission, but they do want to help her get treatment. Will need to fax her labs and information to [facility] [phone]. They need to review her information to make sure she is acceptable. They do have beds available.

## 2016-10-08 NOTE — ED Notes (Signed)
Faxed papers to [facility]
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
## 2016-10-08 NOTE — Discharge Instructions (Signed)
Stop drinking all forms of alcohol.  Go to one of the treatment centers for treatment.

## 2016-10-08 NOTE — ED Notes (Signed)
Pt wanded by security. 

## 2016-10-08 NOTE — BHH Counselor (Addendum)
Per Margarita Grizzle Pt does not meet mental health inpatient criteria. Recommends D/C.  This Probation officer contacted [facility]. [facility] has bed availabiilty. RN states she will fax the Pt's information to [facility]. [facility] states they will call the ED back to report if the Pt is accepted to their detox SA inpatient program.   Lorenza Cambridge, [facility] Triage Specialist

## 2016-10-08 NOTE — ED Notes (Signed)
Pt reports "I am not suicidal as long as you keep me here and get me treatment for detox. If you send me home, I will kill myself".

## 2016-10-08 NOTE — ED Triage Notes (Signed)
Pt arrives via [facility], left [facility] yesterday, was being treated for pancreatitis. Hx of alcohol abuse, drinks 1 liter of vodka/day, last drink 4/10 at 9am. Reports epigastric pain and nausea. Received 4mg  zofran pta. Pt a/ox4.

## 2016-10-13 ENCOUNTER — Emergency Department (HOSPITAL_COMMUNITY)
Admission: EM | Admit: 2016-10-13 | Discharge: 2016-10-15 | Disposition: A | Discharge status: Other Acute Inpatient Hospital | Payer: Self-pay | Attending: Emergency Medicine | Admitting: Emergency Medicine

## 2016-10-13 DIAGNOSIS — F332 Major depressive disorder, recurrent severe without psychotic features: Secondary | ICD-10-CM | POA: Insufficient documentation

## 2016-10-13 DIAGNOSIS — Z79899 Other long term (current) drug therapy: Secondary | ICD-10-CM | POA: Insufficient documentation

## 2016-10-13 DIAGNOSIS — F17210 Nicotine dependence, cigarettes, uncomplicated: Secondary | ICD-10-CM | POA: Insufficient documentation

## 2016-10-13 DIAGNOSIS — Y999 Unspecified external cause status: Secondary | ICD-10-CM | POA: Insufficient documentation

## 2016-10-13 DIAGNOSIS — Y92410 Unspecified street and highway as the place of occurrence of the external cause: Secondary | ICD-10-CM | POA: Insufficient documentation

## 2016-10-13 DIAGNOSIS — Y9301 Activity, walking, marching and hiking: Secondary | ICD-10-CM | POA: Insufficient documentation

## 2016-10-13 DIAGNOSIS — X58XXXA Exposure to other specified factors, initial encounter: Secondary | ICD-10-CM | POA: Insufficient documentation

## 2016-10-13 LAB — RAPID URINE DRUG SCREEN, HOSP PERFORMED
AMPHETAMINES: NOT DETECTED
BARBITURATES: NOT DETECTED
BENZODIAZEPINES: POSITIVE — AB
COCAINE: POSITIVE — AB
Opiates: NOT DETECTED
TETRAHYDROCANNABINOL: NOT DETECTED

## 2016-10-13 LAB — COMPREHENSIVE METABOLIC PANEL
ALBUMIN: 4.8 g/dL (ref 3.5–5.0)
ALT: 22 U/L (ref 14–54)
ANION GAP: 12 (ref 5–15)
AST: 44 U/L — AB (ref 15–41)
Alkaline Phosphatase: 79 U/L (ref 38–126)
BILIRUBIN TOTAL: 0.7 mg/dL (ref 0.3–1.2)
BUN: 6 mg/dL (ref 6–20)
CHLORIDE: 99 mmol/L — AB (ref 101–111)
CO2: 30 mmol/L (ref 22–32)
Calcium: 9.3 mg/dL (ref 8.9–10.3)
Creatinine, Ser: 0.78 mg/dL (ref 0.44–1.00)
GFR calc Af Amer: >60 mL/min (ref >60)
GFR calc non Af Amer: >60 mL/min (ref >60)
Glucose, Bld: 121 mg/dL — ABNORMAL HIGH (ref 65–99)
POTASSIUM: 3.6 mmol/L (ref 3.5–5.1)
SODIUM: 141 mmol/L (ref 135–145)
TOTAL PROTEIN: 8.1 g/dL (ref 6.5–8.1)

## 2016-10-13 LAB — CBC
HEMATOCRIT: 41.4 % (ref 36.0–46.0)
HEMOGLOBIN: 14.8 g/dL (ref 12.0–15.0)
MCH: 32 pg (ref 26.0–34.0)
MCHC: 35.7 g/dL (ref 30.0–36.0)
MCV: 89.6 fL (ref 78.0–100.0)
Platelets: 255 10*3/uL (ref 150–400)
RBC: 4.62 MIL/uL (ref 3.87–5.11)
RDW: 14.3 % (ref 11.5–15.5)
WBC: 6.2 10*3/uL (ref 4.0–10.5)

## 2016-10-13 LAB — ETHANOL: Alcohol, Ethyl (B): 215 mg/dL — ABNORMAL HIGH (ref <5)

## 2016-10-13 LAB — ACETAMINOPHEN LEVEL

## 2016-10-13 LAB — SALICYLATE LEVEL: Salicylate Lvl: <7 mg/dL (ref 2.8–30.0)

## 2016-10-13 MED ORDER — LORAZEPAM 2 MG/ML IJ SOLN: 0.0000 mg | Freq: Four times a day (QID) | INTRAMUSCULAR | Status: DC

## 2016-10-13 MED ORDER — ACETAMINOPHEN 325 MG PO TABS: 650.0000 mg | ORAL_TABLET | ORAL | Status: DC | PRN

## 2016-10-13 MED ORDER — ONDANSETRON HCL 4 MG PO TABS
4.0000 mg | ORAL_TABLET | Freq: Three times a day (TID) | ORAL | Status: DC | PRN
  Administered 2016-10-13: 4 mg via ORAL
  Filled 2016-10-13: qty 1

## 2016-10-13 MED ORDER — IBUPROFEN 200 MG PO TABS: 600.0000 mg | ORAL_TABLET | Freq: Three times a day (TID) | ORAL | Status: DC | PRN

## 2016-10-13 MED ORDER — NICOTINE 21 MG/24HR TD PT24
21.0000 mg | MEDICATED_PATCH | Freq: Every day | TRANSDERMAL | Status: DC
  Administered 2016-10-14: 21 mg via TRANSDERMAL
  Filled 2016-10-13 (×2): qty 1

## 2016-10-13 MED ORDER — ALUM & MAG HYDROXIDE-SIMETH 200-200-20 MG/5ML PO SUSP: 30.0000 mL | ORAL | Status: DC | PRN

## 2016-10-13 MED ORDER — LORAZEPAM 2 MG/ML IJ SOLN: 0.0000 mg | Freq: Two times a day (BID) | INTRAMUSCULAR | Status: DC

## 2016-10-13 MED ORDER — ZOLPIDEM TARTRATE 5 MG PO TABS: 5.0000 mg | ORAL_TABLET | Freq: Every evening | ORAL | Status: DC | PRN

## 2016-10-13 NOTE — ED Triage Notes (Signed)
Patient states she wants to kill herself by walking into car traffic. [employer] was called because some reported the patient standing in front of traffic lights. Patient states she wants to detox from alcohol.
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
## 2016-10-13 NOTE — ED Notes (Signed)
PA sts she will IVC patient

## 2016-10-13 NOTE — ED Notes (Signed)
Pt vomited several times; awaiting to give ativan at midnight due to excessive vomiting

## 2016-10-13 NOTE — ED Notes (Signed)
ED Provider at bedside. Yao 

## 2016-10-13 NOTE — ED Notes (Signed)
telepsych done
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
## 2016-10-13 NOTE — ED Notes (Signed)
Sane nurse at bedside 

## 2016-10-13 NOTE — ED Notes (Signed)
[employer] at bedside & sitter at bedside

## 2016-10-13 NOTE — ED Notes (Signed)
ED Provider at bedside. Abby, PA

## 2016-10-14 ENCOUNTER — Ambulatory Visit (HOSPITAL_COMMUNITY)
Admission: EM | Admit: 2016-10-14 | Discharge: 2016-10-14 | Disposition: A | Discharge status: Home / Self Care | Payer: No Typology Code available for payment source | Source: Ambulatory Visit | Attending: Emergency Medicine | Admitting: Emergency Medicine

## 2016-10-14 DIAGNOSIS — F17210 Nicotine dependence, cigarettes, uncomplicated: Secondary | ICD-10-CM

## 2016-10-14 DIAGNOSIS — R45851 Suicidal ideations: Secondary | ICD-10-CM

## 2016-10-14 DIAGNOSIS — Z79899 Other long term (current) drug therapy: Secondary | ICD-10-CM

## 2016-10-14 DIAGNOSIS — F603 Borderline personality disorder: Secondary | ICD-10-CM

## 2016-10-14 DIAGNOSIS — Z0441 Encounter for examination and observation following alleged adult rape: Secondary | ICD-10-CM | POA: Diagnosis not present

## 2016-10-14 DIAGNOSIS — F1120 Opioid dependence, uncomplicated: Secondary | ICD-10-CM

## 2016-10-14 DIAGNOSIS — F1920 Other psychoactive substance dependence, uncomplicated: Secondary | ICD-10-CM

## 2016-10-14 DIAGNOSIS — F332 Major depressive disorder, recurrent severe without psychotic features: Secondary | ICD-10-CM

## 2016-10-14 LAB — RAPID HIV SCREEN (HIV 1/2 AB+AG)
HIV 1/2 ANTIBODIES: NONREACTIVE
HIV-1 P24 ANTIGEN - HIV24: NONREACTIVE

## 2016-10-14 MED ORDER — ADULT MULTIVITAMIN W/MINERALS CH
1.0000 | ORAL_TABLET | Freq: Every day | ORAL | Status: DC
  Administered 2016-10-14: 1 via ORAL
  Filled 2016-10-14: qty 1

## 2016-10-14 MED ORDER — LORAZEPAM 1 MG PO TABS
0.0000 mg | ORAL_TABLET | Freq: Four times a day (QID) | ORAL | Status: DC
  Administered 2016-10-14: 2 mg via ORAL
  Administered 2016-10-14: 1 mg via ORAL
  Filled 2016-10-14: qty 1
  Filled 2016-10-14: qty 2

## 2016-10-14 MED ORDER — HYDROXYZINE HCL 25 MG PO TABS
25.0000 mg | ORAL_TABLET | Freq: Four times a day (QID) | ORAL | Status: DC | PRN
  Filled 2016-10-14: qty 1

## 2016-10-14 MED ORDER — ELVITEG-COBIC-EMTRICIT-TENOFAF 150-150-200-10 MG PO TABS: 1.0000 | ORAL_TABLET | Freq: Every day | ORAL | 0 refills | Status: DC

## 2016-10-14 MED ORDER — THIAMINE HCL 100 MG/ML IJ SOLN
100.0000 mg | Freq: Once | INTRAMUSCULAR | Status: AC
  Administered 2016-10-14: 100 mg via INTRAMUSCULAR
  Filled 2016-10-14: qty 2

## 2016-10-14 MED ORDER — CHLORDIAZEPOXIDE HCL 25 MG PO CAPS: 25.0000 mg | ORAL_CAPSULE | Freq: Three times a day (TID) | ORAL | Status: DC

## 2016-10-14 MED ORDER — LOPERAMIDE HCL 2 MG PO CAPS: 2.0000 mg | ORAL_CAPSULE | ORAL | Status: DC | PRN

## 2016-10-14 MED ORDER — CHLORDIAZEPOXIDE HCL 25 MG PO CAPS: 25.0000 mg | ORAL_CAPSULE | Freq: Every day | ORAL | Status: DC

## 2016-10-14 MED ORDER — GABAPENTIN 100 MG PO CAPS
200.0000 mg | ORAL_CAPSULE | Freq: Three times a day (TID) | ORAL | Status: DC
  Administered 2016-10-14 – 2016-10-15 (×3): 200 mg via ORAL
  Filled 2016-10-14 (×3): qty 2

## 2016-10-14 MED ORDER — ONDANSETRON 4 MG PO TBDP
4.0000 mg | ORAL_TABLET | Freq: Four times a day (QID) | ORAL | Status: DC | PRN
  Administered 2016-10-14 – 2016-10-15 (×2): 4 mg via ORAL
  Filled 2016-10-14 (×2): qty 1

## 2016-10-14 MED ORDER — CHLORDIAZEPOXIDE HCL 25 MG PO CAPS
25.0000 mg | ORAL_CAPSULE | Freq: Four times a day (QID) | ORAL | Status: DC
  Administered 2016-10-14 – 2016-10-15 (×3): 25 mg via ORAL
  Filled 2016-10-14 (×3): qty 1

## 2016-10-14 MED ORDER — LORAZEPAM 1 MG PO TABS
1.0000 mg | ORAL_TABLET | Freq: Four times a day (QID) | ORAL | Status: DC
  Administered 2016-10-14: 1 mg via ORAL
  Filled 2016-10-14: qty 1

## 2016-10-14 MED ORDER — CHLORDIAZEPOXIDE HCL 25 MG PO CAPS: 25.0000 mg | ORAL_CAPSULE | ORAL | Status: DC

## 2016-10-14 MED ORDER — LORAZEPAM 1 MG PO TABS: 1.0000 mg | ORAL_TABLET | Freq: Three times a day (TID) | ORAL | Status: DC

## 2016-10-14 MED ORDER — VITAMIN B-1 100 MG PO TABS: 100.0000 mg | ORAL_TABLET | Freq: Every day | ORAL | Status: DC

## 2016-10-14 MED ORDER — LORATADINE 10 MG PO TABS
10.0000 mg | ORAL_TABLET | Freq: Every day | ORAL | Status: DC
  Administered 2016-10-14: 10 mg via ORAL
  Filled 2016-10-14 (×2): qty 1

## 2016-10-14 MED ORDER — METHOCARBAMOL 500 MG PO TABS
1000.0000 mg | ORAL_TABLET | Freq: Four times a day (QID) | ORAL | Status: DC | PRN
Start: 2016-10-14 — End: 2016-10-15
  Administered 2016-10-15: 1000 mg via ORAL
  Filled 2016-10-14: qty 2

## 2016-10-14 MED ORDER — FLUOXETINE HCL 10 MG PO CAPS
10.0000 mg | ORAL_CAPSULE | Freq: Every day | ORAL | Status: DC
  Administered 2016-10-14: 10 mg via ORAL
  Filled 2016-10-14: qty 1

## 2016-10-14 MED ORDER — LORAZEPAM 1 MG PO TABS: 1.0000 mg | ORAL_TABLET | Freq: Every day | ORAL | Status: DC

## 2016-10-14 MED ORDER — LORAZEPAM 1 MG PO TABS: 1.0000 mg | ORAL_TABLET | Freq: Four times a day (QID) | ORAL | Status: DC | PRN

## 2016-10-14 MED ORDER — CEFTRIAXONE SODIUM 250 MG IJ SOLR
250.0000 mg | Freq: Once | INTRAMUSCULAR | Status: AC
  Administered 2016-10-14: 250 mg via INTRAMUSCULAR
  Filled 2016-10-14: qty 250

## 2016-10-14 MED ORDER — AZITHROMYCIN 250 MG PO TABS
1000.0000 mg | ORAL_TABLET | Freq: Once | ORAL | Status: AC
  Administered 2016-10-14: 1000 mg via ORAL
  Filled 2016-10-14: qty 4

## 2016-10-14 MED ORDER — ELVITEG-COBIC-EMTRICIT-TENOFAF 150-150-200-10 MG PO TABS
1.0000 | ORAL_TABLET | Freq: Every day | ORAL | Status: DC
  Administered 2016-10-14: 1 via ORAL
  Filled 2016-10-14 (×2): qty 1

## 2016-10-14 MED ORDER — CHLORDIAZEPOXIDE HCL 25 MG PO CAPS
25.0000 mg | ORAL_CAPSULE | Freq: Four times a day (QID) | ORAL | Status: DC | PRN
  Administered 2016-10-15: 25 mg via ORAL
  Filled 2016-10-14: qty 1

## 2016-10-14 MED ORDER — HYDROXYZINE HCL 25 MG PO TABS
25.0000 mg | ORAL_TABLET | Freq: Four times a day (QID) | ORAL | Status: DC | PRN
  Administered 2016-10-15: 25 mg via ORAL

## 2016-10-14 MED ORDER — LIDOCAINE HCL (PF) 1 % IJ SOLN
0.9000 mL | Freq: Once | INTRAMUSCULAR | Status: AC
  Administered 2016-10-14: 0.9 mL
  Filled 2016-10-14: qty 30

## 2016-10-14 MED ORDER — METRONIDAZOLE 500 MG PO TABS: 2000.0000 mg | ORAL_TABLET | Freq: Once | ORAL | Status: DC

## 2016-10-14 MED ORDER — ONDANSETRON 4 MG PO TBDP: 4.0000 mg | ORAL_TABLET | Freq: Four times a day (QID) | ORAL | Status: DC | PRN

## 2016-10-14 MED ORDER — DICYCLOMINE HCL 20 MG PO TABS
20.0000 mg | ORAL_TABLET | Freq: Three times a day (TID) | ORAL | Status: DC
  Administered 2016-10-14 – 2016-10-15 (×2): 20 mg via ORAL
  Filled 2016-10-14 (×2): qty 1

## 2016-10-14 MED ORDER — LORAZEPAM 1 MG PO TABS: 1.0000 mg | ORAL_TABLET | Freq: Two times a day (BID) | ORAL | Status: DC

## 2016-10-14 NOTE — BHH Counselor (Signed)
Patient accepted to [facility] by Dr. Jerilee Hoh. Room [phone]. Room 302. Support paperwork completed. Transport pending.

## 2016-10-14 NOTE — ED Provider Notes (Signed)
12:30 PM SANE forensic nursing requests that patient be given STI prophylaxis. Patient also had HIV testing performed as well as initiation of Genvoya.   Per the SANE request, at patient's eventual discharge, patient will need a prescription for [facility] for a 28 day prescription minus the amount of days patient that the received it during her care.  Patient will also need to follow-up on the lab testing being sent today. (HIV, Hep B, C)         Courtney Paris, MD 10/14/16 2101

## 2016-10-14 NOTE — Discharge Planning (Signed)
[facility] consulted by  EDP regarding nPEP process: 1.) Please assist with acquiring patient assistance for HIV post-exposure medications EDCM completed [employer] Voucher application, will file application on day of discharge from ED (as advised by voucher company) and 2.) Please assist with setting up follow-up labs (pt states she plans on returning to [place], her home state and will follow up with MD there for labs.Marland Kitchen  [facility] reviewed patient chart to obtain demographic information.

## 2016-10-14 NOTE — ED Provider Notes (Signed)
1:51 PM BP 115/82 (BP Location: Left Arm)   Pulse 92   Temp 98.8 F (37.1 C) (Oral)   Resp 16   SpO2 100%  I was asked to see Newt Lukes by Dr. Sherry Ruffing as she was my patient las night. The patient was switched from the iv ativan detox to PO detox. She was on the phone and when she got off became agitated, pacing the hall, demanding to be seen by  By a doctor. Patient reports a hx of DTs, however, she appeared somewhat intoxicated with slurred speech last night an a BAL of 219. She reports drinking in excess of3 large bottles of liquor daily. Patient does not appear to be in acute withdraw. I will switch her to the librium detox protocol. I did make it very clear yesterday that we would not give narcotics. The patient agreed with this plan.  2:07 PM I spoke with the patient who has not had opiates in >5 days but feels that she is in withdrawal.I placed orders for bentyl and robaxin as well.   Margarita Mail, PA-C 10/14/16 [place], MD 10/14/16 2102
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
## 2016-10-14 NOTE — ED Notes (Signed)
Pt c/o anxiety. MD notified.

## 2016-10-14 NOTE — Progress Notes (Signed)
10/14/16 1345:  LRT introduced self to pt and offered activities.  Pt came down to the dayroom to participate in activities.  Pt was pleasant but frigidity.  Pt kept rocking around in her chair.  Pt was social with peers.   Victorino Sparrow, LRT/CTRS

## 2016-10-14 NOTE — ED Notes (Addendum)
Report to include Situation, Background, Assessment, and Recommendations received from RN. Patient alert and oriented, warm and dry, in no acute distress. Patient denies SI, HI, AVH and pain. Patient made aware of Q15 minute rounds and security cameras for their safety. Patient instructed to approach staff for needs or concerns.

## 2016-10-14 NOTE — ED Notes (Addendum)
Nursing Progress Note: 7p-7a D: Pt currently presents with a depressed/labile affect and behavior. Pt states "I'm just real mad I had to move rooms. I'm withdrawaling something fierce." Pt reports off and on sleep with current medication regimen.   A: Pt provided with medications per providers orders. Pt's labs and vitals were monitored throughout the night. Pt supported emotionally and encouraged to express concerns and questions. Pt educated on medications.  R: Pt's safety ensured with 15 minute and environmental checks. Pt currently denies SI/HI/Self Harm and AVH. Pt verbally contracts to seek staff if SI/HI or A/VH occurs and to consult with staff before acting on any harmful thoughts. Will continue to monitor.
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
## 2016-10-14 NOTE — BH Assessment (Signed)
[place] [place] Assessment Progress Note  Per Corena Pilgrim, MD, this pt requires psychiatric hospitalization at this time.  The following facilities have been contacted to seek placement for this pt, with results as noted:  Beds available, information sent, decision pending:  [place]:  [place], [place] Triage Specialist [phone]

## 2016-10-14 NOTE — BH Assessment (Signed)
Patient has been accepted to [facility].  Accepting physician is Dr. Henderson [place].  Attending Physician will be Dr. Jerilee Hoh.  Patient has been assigned to room 322, by Latimer F.  Call report to [phone].  Representative/Transfer Coordinator is Dispensing optician Patient pre-admitted by [facility] Patient Access Holley Raring)  [facility] ER Staff Sharlene Motts., NP & Marlou Porch, TTS) made aware of acceptance.

## 2016-10-14 NOTE — ED Notes (Signed)
Pt verbally agitated reports that she is going to show off because "I want shots to go to sleep." Pt requesting to see a medical doctor. This nurse notified EDP Dr. Sherry Ruffing. Will continue to monitor.

## 2016-10-14 NOTE — ED Notes (Signed)
SANE nurse at bedside.
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
## 2016-10-14 NOTE — ED Notes (Signed)
[place] in case management requesting to be called when pt is discharged ext [phone].

## 2016-10-15 ENCOUNTER — Encounter (HOSPITAL_COMMUNITY): Payer: Self-pay | Admitting: Psychiatry

## 2016-10-15 ENCOUNTER — Inpatient Hospital Stay
Admission: EM | Admit: 2016-10-15 | Discharge: 2016-10-23 | DRG: 885 | Disposition: A | Discharge status: Home / Self Care | Payer: Medicaid - Out of State | Source: Intra-hospital | Attending: Psychiatry | Admitting: Psychiatry

## 2016-10-15 DIAGNOSIS — F332 Major depressive disorder, recurrent severe without psychotic features: Principal | ICD-10-CM

## 2016-10-15 DIAGNOSIS — G8929 Other chronic pain: Secondary | ICD-10-CM | POA: Diagnosis present

## 2016-10-15 DIAGNOSIS — Y907 Blood alcohol level of 200-239 mg/100 ml: Secondary | ICD-10-CM | POA: Diagnosis present

## 2016-10-15 DIAGNOSIS — F1123 Opioid dependence with withdrawal: Secondary | ICD-10-CM | POA: Diagnosis present

## 2016-10-15 DIAGNOSIS — G4700 Insomnia, unspecified: Secondary | ICD-10-CM | POA: Diagnosis present

## 2016-10-15 DIAGNOSIS — F603 Borderline personality disorder: Secondary | ICD-10-CM | POA: Diagnosis present

## 2016-10-15 DIAGNOSIS — Z91018 Allergy to other foods: Secondary | ICD-10-CM

## 2016-10-15 DIAGNOSIS — M797 Fibromyalgia: Secondary | ICD-10-CM | POA: Diagnosis present

## 2016-10-15 DIAGNOSIS — F10239 Alcohol dependence with withdrawal, unspecified: Secondary | ICD-10-CM | POA: Diagnosis present

## 2016-10-15 DIAGNOSIS — F1420 Cocaine dependence, uncomplicated: Secondary | ICD-10-CM

## 2016-10-15 DIAGNOSIS — Z90710 Acquired absence of both cervix and uterus: Secondary | ICD-10-CM

## 2016-10-15 DIAGNOSIS — K5900 Constipation, unspecified: Secondary | ICD-10-CM | POA: Diagnosis present

## 2016-10-15 DIAGNOSIS — Z881 Allergy status to other antibiotic agents status: Secondary | ICD-10-CM

## 2016-10-15 DIAGNOSIS — Z91410 Personal history of adult physical and sexual abuse: Secondary | ICD-10-CM

## 2016-10-15 DIAGNOSIS — R45851 Suicidal ideations: Secondary | ICD-10-CM | POA: Diagnosis present

## 2016-10-15 DIAGNOSIS — F4310 Post-traumatic stress disorder, unspecified: Secondary | ICD-10-CM | POA: Diagnosis present

## 2016-10-15 DIAGNOSIS — Z79890 Hormone replacement therapy (postmenopausal): Secondary | ICD-10-CM

## 2016-10-15 DIAGNOSIS — Z780 Asymptomatic menopausal state: Secondary | ICD-10-CM | POA: Diagnosis not present

## 2016-10-15 DIAGNOSIS — F429 Obsessive-compulsive disorder, unspecified: Secondary | ICD-10-CM | POA: Diagnosis present

## 2016-10-15 DIAGNOSIS — Z9049 Acquired absence of other specified parts of digestive tract: Secondary | ICD-10-CM

## 2016-10-15 DIAGNOSIS — K859 Acute pancreatitis without necrosis or infection, unspecified: Secondary | ICD-10-CM | POA: Diagnosis present

## 2016-10-15 DIAGNOSIS — F17210 Nicotine dependence, cigarettes, uncomplicated: Secondary | ICD-10-CM | POA: Diagnosis present

## 2016-10-15 DIAGNOSIS — F1120 Opioid dependence, uncomplicated: Secondary | ICD-10-CM

## 2016-10-15 DIAGNOSIS — Z915 Personal history of self-harm: Secondary | ICD-10-CM

## 2016-10-15 DIAGNOSIS — Z716 Tobacco abuse counseling: Secondary | ICD-10-CM

## 2016-10-15 DIAGNOSIS — F411 Generalized anxiety disorder: Secondary | ICD-10-CM | POA: Diagnosis present

## 2016-10-15 DIAGNOSIS — F909 Attention-deficit hyperactivity disorder, unspecified type: Secondary | ICD-10-CM | POA: Diagnosis present

## 2016-10-15 DIAGNOSIS — R4587 Impulsiveness: Secondary | ICD-10-CM | POA: Diagnosis present

## 2016-10-15 DIAGNOSIS — F10939 Alcohol use, unspecified with withdrawal, unspecified: Secondary | ICD-10-CM

## 2016-10-15 DIAGNOSIS — F17200 Nicotine dependence, unspecified, uncomplicated: Secondary | ICD-10-CM

## 2016-10-15 DIAGNOSIS — F1020 Alcohol dependence, uncomplicated: Secondary | ICD-10-CM

## 2016-10-15 DIAGNOSIS — F1193 Opioid use, unspecified with withdrawal: Secondary | ICD-10-CM

## 2016-10-15 LAB — HEPATITIS B SURFACE ANTIGEN: Hepatitis B Surface Ag: NEGATIVE

## 2016-10-15 LAB — HEPATITIS C ANTIBODY: HCV Ab: >11 s/co ratio — ABNORMAL HIGH (ref 0.0–0.9)

## 2016-10-15 MED ORDER — IBUPROFEN 600 MG PO TABS
600.0000 mg | ORAL_TABLET | Freq: Three times a day (TID) | ORAL | Status: DC | PRN
  Administered 2016-10-15: 600 mg via ORAL
  Filled 2016-10-15: qty 1

## 2016-10-15 MED ORDER — METRONIDAZOLE 500 MG PO TABS
2000.0000 mg | ORAL_TABLET | Freq: Once | ORAL | Status: AC
  Administered 2016-10-15: 2000 mg via ORAL
  Filled 2016-10-15: qty 4

## 2016-10-15 MED ORDER — FLUOXETINE HCL 10 MG PO CAPS
10.0000 mg | ORAL_CAPSULE | Freq: Every day | ORAL | Status: DC
  Administered 2016-10-15: 10 mg via ORAL
  Filled 2016-10-15: qty 1

## 2016-10-15 MED ORDER — ALUM & MAG HYDROXIDE-SIMETH 200-200-20 MG/5ML PO SUSP
30.0000 mL | ORAL | Status: DC | PRN
  Administered 2016-10-16 – 2016-10-17 (×2): 30 mL via ORAL
  Filled 2016-10-15 (×2): qty 30

## 2016-10-15 MED ORDER — BUPRENORPHINE HCL-NALOXONE HCL 8-2 MG SL SUBL
1.0000 | SUBLINGUAL_TABLET | Freq: Every day | SUBLINGUAL | Status: DC
  Administered 2016-10-15 – 2016-10-18 (×4): 1 via SUBLINGUAL
  Filled 2016-10-15 (×4): qty 1

## 2016-10-15 MED ORDER — METHOCARBAMOL 500 MG PO TABS
1000.0000 mg | ORAL_TABLET | Freq: Four times a day (QID) | ORAL | Status: DC | PRN
  Filled 2016-10-15: qty 2

## 2016-10-15 MED ORDER — LORAZEPAM 2 MG PO TABS
2.0000 mg | ORAL_TABLET | Freq: Three times a day (TID) | ORAL | Status: DC | PRN
  Administered 2016-10-15: 2 mg via ORAL
  Filled 2016-10-15: qty 1

## 2016-10-15 MED ORDER — LOPERAMIDE HCL 2 MG PO CAPS
4.0000 mg | ORAL_CAPSULE | Freq: Three times a day (TID) | ORAL | Status: DC
  Filled 2016-10-15 (×2): qty 2

## 2016-10-15 MED ORDER — ELVITEG-COBIC-EMTRICIT-TENOFAF 150-150-200-10 MG PO TABS
1.0000 | ORAL_TABLET | Freq: Every day | ORAL | Status: DC
Start: 2016-10-15 — End: 2016-10-23
  Administered 2016-10-15 – 2016-10-23 (×9): 1 via ORAL
  Filled 2016-10-15 (×9): qty 1

## 2016-10-15 MED ORDER — HYDROXYZINE HCL 50 MG PO TABS: 50.0000 mg | ORAL_TABLET | Freq: Three times a day (TID) | ORAL | Status: DC

## 2016-10-15 MED ORDER — CHLORDIAZEPOXIDE HCL 25 MG PO CAPS
50.0000 mg | ORAL_CAPSULE | Freq: Three times a day (TID) | ORAL | Status: DC
  Administered 2016-10-15: 50 mg via ORAL
  Filled 2016-10-15: qty 2

## 2016-10-15 MED ORDER — OLANZAPINE 5 MG PO TABS: 2.5000 mg | ORAL_TABLET | Freq: Three times a day (TID) | ORAL | Status: DC

## 2016-10-15 MED ORDER — MAGNESIUM HYDROXIDE 400 MG/5ML PO SUSP
30.0000 mL | Freq: Every day | ORAL | Status: DC | PRN
  Administered 2016-10-19 – 2016-10-22 (×4): 30 mL via ORAL
  Filled 2016-10-15 (×5): qty 30

## 2016-10-15 MED ORDER — ACETAMINOPHEN 325 MG PO TABS: 650.0000 mg | ORAL_TABLET | Freq: Four times a day (QID) | ORAL | Status: DC | PRN

## 2016-10-15 MED ORDER — ONDANSETRON 4 MG PO TBDP
4.0000 mg | ORAL_TABLET | Freq: Three times a day (TID) | ORAL | Status: DC
  Administered 2016-10-15 – 2016-10-16 (×2): 4 mg via ORAL
  Filled 2016-10-15 (×3): qty 1

## 2016-10-15 MED ORDER — GABAPENTIN 100 MG PO CAPS
200.0000 mg | ORAL_CAPSULE | Freq: Three times a day (TID) | ORAL | Status: DC
  Administered 2016-10-15: 200 mg via ORAL
  Filled 2016-10-15: qty 2

## 2016-10-15 MED ORDER — METHOCARBAMOL 500 MG PO TABS
1000.0000 mg | ORAL_TABLET | Freq: Three times a day (TID) | ORAL | Status: DC
  Administered 2016-10-15 – 2016-10-16 (×2): 1000 mg via ORAL
  Filled 2016-10-15 (×2): qty 2

## 2016-10-15 MED ORDER — GABAPENTIN 100 MG PO CAPS
200.0000 mg | ORAL_CAPSULE | Freq: Once | ORAL | Status: AC
Start: 2016-10-15 — End: 2016-10-16
  Administered 2016-10-16: 200 mg via ORAL
  Filled 2016-10-15: qty 2

## 2016-10-15 MED ORDER — NICOTINE 21 MG/24HR TD PT24
21.0000 mg | MEDICATED_PATCH | Freq: Every day | TRANSDERMAL | Status: DC
  Administered 2016-10-15 – 2016-10-23 (×9): 21 mg via TRANSDERMAL
  Filled 2016-10-15 (×9): qty 1

## 2016-10-15 MED ORDER — OLANZAPINE 5 MG PO TABS
2.5000 mg | ORAL_TABLET | Freq: Three times a day (TID) | ORAL | Status: DC
  Administered 2016-10-15 – 2016-10-16 (×3): 2.5 mg via ORAL
  Filled 2016-10-15 (×3): qty 1

## 2016-10-15 MED ORDER — LOPERAMIDE HCL 2 MG PO CAPS
2.0000 mg | ORAL_CAPSULE | ORAL | Status: DC | PRN
  Administered 2016-10-15: 2 mg via ORAL
  Filled 2016-10-15: qty 1

## 2016-10-15 MED ORDER — CHLORDIAZEPOXIDE HCL 25 MG PO CAPS
50.0000 mg | ORAL_CAPSULE | Freq: Four times a day (QID) | ORAL | Status: DC
  Administered 2016-10-15 – 2016-10-16 (×3): 50 mg via ORAL
  Filled 2016-10-15 (×3): qty 2

## 2016-10-15 MED ORDER — IBUPROFEN 600 MG PO TABS
600.0000 mg | ORAL_TABLET | Freq: Three times a day (TID) | ORAL | Status: DC
  Administered 2016-10-15 – 2016-10-16 (×2): 600 mg via ORAL
  Filled 2016-10-15 (×3): qty 1

## 2016-10-15 MED ORDER — ONDANSETRON 4 MG PO TBDP
4.0000 mg | ORAL_TABLET | Freq: Four times a day (QID) | ORAL | Status: DC | PRN
  Administered 2016-10-15: 4 mg via ORAL
  Filled 2016-10-15: qty 1
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
## 2016-10-15 NOTE — Progress Notes (Addendum)
Pt much calmer, softer speech but with slurred speech, appears very drowsy after medication administratation as ordered. Pt gait is unsteady and much encouragement was provided related to fall prevention and the fact that pt is high fall risk. Pt verbalizes understanding, but continues to require much redirection. Safety maintained with every 15 minute checks. Will continue to monitor.

## 2016-10-15 NOTE — ED Notes (Signed)
Pt transported to [facility] by [facility] Department. Al belongings returned to pt who signed for same. Pt made demands for new clothes and Junie Panning, Social Work, gave her some new clothes prior to transport. At the time of transport pt was cooperative.

## 2016-10-15 NOTE — Progress Notes (Signed)
Pt resting in her bed with even, unlabored respirations noted. Responds to voice. Safety maintained. Will continue to monitor.

## 2016-10-15 NOTE — Discharge Instructions (Signed)

## 2016-10-15 NOTE — [facility] Group Notes (Signed)
[facility] LCSW Group Therapy 10/15/2016 1:15 PM  Type of Therapy: Group Therapy- Emotion Regulation  Participation Level: Pt did not attend.   Georga Kaufmann, MSW, LCSWA 10/15/2016 2:16 PM
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
## 2016-10-15 NOTE — Tx Team (Signed)
Initial Treatment Plan 10/15/2016 3:51 PM [name] [id number]    PATIENT STRESSORS: Financial difficulties Legal issue Substance abuse Traumatic event   PATIENT STRENGTHS: Ability for insight Capable of independent living General fund of knowledge Physical Health Supportive family/friends   PATIENT IDENTIFIED PROBLEMS:   "I'm withdrawing from alcohol and opiates."    Suicidal ideation with plan to walk into traffic               DISCHARGE CRITERIA:  Adequate post-discharge living arrangements Improved stabilization in mood, thinking, and/or behavior Motivation to continue treatment in a less acute level of care Verbal commitment to aftercare and medication compliance Withdrawal symptoms are absent or subacute and managed without 24-hour nursing intervention  PRELIMINARY DISCHARGE PLAN: Attend aftercare/continuing care group Outpatient therapy Return to previous living arrangement  PATIENT/FAMILY INVOLVEMENT: This treatment plan has been presented to and reviewed with the patient, Carrie Phillips.  The patient and family have been given the opportunity to ask questions and make suggestions.  Delfin Edis, RN 10/15/2016, 3:51 PM
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
## 2016-10-16 ENCOUNTER — Encounter: Payer: Self-pay | Admitting: Surgery

## 2016-10-16 MED ORDER — CHLORDIAZEPOXIDE HCL 25 MG PO CAPS
25.0000 mg | ORAL_CAPSULE | Freq: Once | ORAL | Status: AC
  Administered 2016-10-16: 25 mg via ORAL
  Filled 2016-10-16: qty 1

## 2016-10-16 MED ORDER — CHLORDIAZEPOXIDE HCL 25 MG PO CAPS
50.0000 mg | ORAL_CAPSULE | Freq: Four times a day (QID) | ORAL | Status: DC
  Administered 2016-10-16 – 2016-10-17 (×4): 50 mg via ORAL
  Filled 2016-10-16 (×4): qty 2

## 2016-10-16 MED ORDER — OLANZAPINE 5 MG PO TABS
2.5000 mg | ORAL_TABLET | Freq: Two times a day (BID) | ORAL | Status: AC
  Administered 2016-10-16: 2.5 mg via ORAL
  Filled 2016-10-16: qty 1

## 2016-10-16 MED ORDER — CHLORDIAZEPOXIDE HCL 25 MG PO CAPS
50.0000 mg | ORAL_CAPSULE | Freq: Once | ORAL | Status: AC
  Administered 2016-10-16: 50 mg via ORAL
  Filled 2016-10-16: qty 2

## 2016-10-16 MED ORDER — DIPHENHYDRAMINE HCL 25 MG PO CAPS
25.0000 mg | ORAL_CAPSULE | Freq: Every day | ORAL | Status: DC
  Administered 2016-10-16: 25 mg via ORAL
  Filled 2016-10-16: qty 1

## 2016-10-16 MED ORDER — ONDANSETRON 4 MG PO TBDP
4.0000 mg | ORAL_TABLET | Freq: Three times a day (TID) | ORAL | Status: DC | PRN
  Administered 2016-10-16 – 2016-10-23 (×9): 4 mg via ORAL
  Filled 2016-10-16 (×10): qty 1

## 2016-10-16 MED ORDER — METHOCARBAMOL 500 MG PO TABS
500.0000 mg | ORAL_TABLET | Freq: Three times a day (TID) | ORAL | Status: DC | PRN
Start: 2016-10-16 — End: 2016-10-17
  Administered 2016-10-17: 500 mg via ORAL
  Filled 2016-10-16: qty 1

## 2016-10-16 MED ORDER — ESTRADIOL 1 MG PO TABS
0.5000 mg | ORAL_TABLET | Freq: Every day | ORAL | Status: DC
  Administered 2016-10-16 – 2016-10-23 (×8): 0.5 mg via ORAL
  Filled 2016-10-16 (×5): qty 1
  Filled 2016-10-16: qty 0.5
  Filled 2016-10-16 (×2): qty 1

## 2016-10-16 MED ORDER — ENSURE ENLIVE PO LIQD
237.0000 mL | Freq: Three times a day (TID) | ORAL | Status: DC
  Administered 2016-10-16 – 2016-10-22 (×12): 237 mL via ORAL

## 2016-10-16 MED ORDER — CHLORDIAZEPOXIDE HCL 25 MG PO CAPS
25.0000 mg | ORAL_CAPSULE | Freq: Three times a day (TID) | ORAL | Status: DC
  Administered 2016-10-16: 25 mg via ORAL
  Filled 2016-10-16: qty 1

## 2016-10-16 MED ORDER — OLANZAPINE 5 MG PO TABS: 2.5000 mg | ORAL_TABLET | Freq: Two times a day (BID) | ORAL | Status: DC

## 2016-10-16 MED ORDER — ACETAMINOPHEN 500 MG PO TABS
1000.0000 mg | ORAL_TABLET | Freq: Four times a day (QID) | ORAL | Status: DC | PRN
  Administered 2016-10-16 – 2016-10-22 (×6): 1000 mg via ORAL
  Filled 2016-10-16 (×6): qty 2

## 2016-10-16 MED ORDER — IBUPROFEN 600 MG PO TABS
600.0000 mg | ORAL_TABLET | Freq: Four times a day (QID) | ORAL | Status: DC | PRN
  Administered 2016-10-16 – 2016-10-17 (×2): 600 mg via ORAL
  Filled 2016-10-16 (×2): qty 1

## 2016-10-16 MED ORDER — LOPERAMIDE HCL 2 MG PO CAPS: 4.0000 mg | ORAL_CAPSULE | ORAL | Status: DC | PRN

## 2016-10-16 NOTE — BHH Group Notes (Signed)
[place] LCSW Group Therapy   10/16/2016 1pm   Type of Therapy: Group Therapy   Participation Level: Active   Participation Quality: Attentive, Sharing and Supportive   Affect: Appropriate   Cognitive: Alert and Oriented   Insight: Developing/Improving and Engaged   Engagement in Therapy: Developing/Improving and Engaged   Modes of Intervention: Clarification, Confrontation, Discussion, Education, Exploration, Limit-setting, Orientation, Problem-solving, Rapport Building, Art therapist, Socialization and Support   Summary of Progress/Problems: The topic for group was balance in life. Today's group focused on defining balance in one's own words, identifying things that can knock one off balance, and exploring healthy ways to maintain balance in life. Group members were asked to provide an example of a time when they felt off balance, describe how they handled that situation, and process healthier ways to regain balance in the future. Group members were asked to share the most important tool for maintaining balance that they learned while at [facility] and how they plan to apply this method after discharge. Patient identified "everything" as being out of balance. She stated that she has to get many areas in order to provide a decent life for her and her child. CSW provided support to patient.   Glorious Peach, MSW, LCSWA 10/16/2016, 2:07PM

## 2016-10-16 NOTE — Progress Notes (Unsigned)
ED CM contacted  [facility], spoke with [facility]  RN on unit, informed her of voucher #needed for nPEP prophylaxis anti-viral medication for HIV exposure Advancing Access Program. She is to complete 28 day dosage of Genvoya retroviral was started in the [facility]  ED on 4/17.  Patient will need a prescription for Genvoya upon discharge for the remaining days. Patient can fill at any retail pharmacy by providing the voucher [phone] to redeem medication. CM placed voucher information on patient's  AVS .  Laurena Slimmer RN BSN NCM  [phone]

## 2016-10-16 NOTE — Plan of Care (Signed)
Problem: Safety: Goal: Ability to remain free from injury will improve Outcome: Progressing Patient made frequent trips to the Nurses with multiple requests.  She responded well to limits and information while needs were met.
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
## 2016-10-16 NOTE — [facility] Suicide Risk Assessment (Addendum)
[facility]:  Family/Significant Other Suicide Prevention Education  Suicide Prevention Education:  Contact Attempts: Maxyne Derocher, mother, [phone], has been identified by the patient as the family member/significant other with whom the patient will be residing, and identified as the person(s) who will aid the patient in the event of a mental health crisis.  With written consent from the patient, two attempts were made to provide suicide prevention education, prior to and/or following the patient's discharge.  We were unsuccessful in providing suicide prevention education.  A suicide education pamphlet was given to the patient to share with family/significant other.  Date and time of first attempt:10/16/16, 77 Date and time of second attempt: 10/21/16, 1113  Joanne Chars, LCSW 10/16/2016, 8:48 AM

## 2016-10-16 NOTE — Plan of Care (Signed)
Problem: Coping: Goal: Ability to cope will improve Outcome: Progressing Patient is able to verbalize frustrations. Has few coping skills to deal with frustrations. Encouraged to continue to express needs and practice patience when waiting for requests.

## 2016-10-16 NOTE — Progress Notes (Signed)
NUTRITION ASSESSMENT  Pt identified as at risk on the Malnutrition Screen Tool  INTERVENTION: 1. Educated patient on the importance of nutrition and encouraged intake of food and beverages. 2. Discussed weight goals. 3. Supplements: Ensure Enlive po TID, each supplement provides 350 kcal and 20 grams of protein  NUTRITION DIAGNOSIS: Unintentional weight loss related to sub-optimal intake as evidenced by pt report.   Goal: Pt to meet >/= 90% of their estimated nutrition needs.  Monitor:  PO intake  Assessment:  36 y.o. female with PMHx of fibromyalgia, endometriosis, anxiety, PTSD transferred from [facility] emergency department due to suicidal ideation and substance abuse.  Patient reports her appetite has been poor for the past 4-5 months. She has been experiencing chronic nausea and abdominal pain. She reports she has had two episodes of acute pancreatitis she was hospitalized for (denies any chronic pancreatitis). She reports she has "bad teeth" and therefore has to eat softer foods. Patient reports that PTA she was only eating one meal per day and occasionally drank Ensure. She reports that she eats "junk food" but is unable to clarify any further on usual intake. She reports that here she has been attempting to eat three meals per day (meal intake ranges from 0-100%). She reports she has lost 18 lbs (14.8% body weight) over the past 2-3 weeks, which would be significant for time frame. She reports this off the fact that she is 104 lbs now. However, in Care Everywhere patient was 116 lbs last year on 10/11/2015, and recently 108 lbs on 09/22/2016. Weight loss may have been over a longer period of time. Patient requests to have Ensure three times per day.   Height: Ht Readings from Last 1 Encounters:  10/15/16 5\' 1"  (1.549 m)    Weight: Wt Readings from Last 1 Encounters:  10/15/16 104 lb (47.2 kg)    Weight Hx: Wt Readings from Last 10 Encounters:  10/15/16 104 lb (47.2 kg)   10/03/16 110 lb (49.9 kg)    BMI:  Body mass index is 19.65 kg/m. Pt meets criteria for normal weight based on current BMI.  Estimated Nutritional Needs: Kcal: 25-30 kcal/kg Protein: > 1 gram protein/kg Fluid: 1 ml/kcal  Diet Order: Diet regular Room service appropriate? Yes; Fluid consistency: Thin Pt is also offered choice of unit snacks mid-morning and mid-afternoon.  Pt is eating as desired.   Lab results and medications reviewed.   Willey Blade, MS, RD, LDN Pager: [phone] After Hours Pager: [phone]

## 2016-10-16 NOTE — Progress Notes (Signed)
Recreation Therapy Notes  Date: 04.19.18 Time: 9:30 am Location: Craft Room  Group Topic: Leisure Education  Goal Area(s) Addresses:  Patient will identify activity for each letter of the alphabet. Patient will verbalize ability to integrate positive leisure into life post d/c. Patient will verbalize ability to use leisure as a Technical sales engineer.  Behavioral Response: Attentive, Interactive  Intervention: Leisure Alphabet  Activity: Patients were given a Leisure Air traffic controller and were instructed to write healthy leisure activities for each letter of the alphabet.  Education: LRT educated patients on what they need to participate in leisure.  Education Outcome: Acknowledges education/In group clarification offered   Clinical Observations/Feedback: Patient wrote healthy leisure activities. Patient would get a little off topic when peers got off topic. Patient contributed to group discussion by stating that this activity was difficult and why, what she needs to participate in leisure, and what makes leisure a good coping skill.  Leonette Monarch, LRT/CTRS 10/16/2016 10:18 AM

## 2016-10-16 NOTE — Progress Notes (Signed)
Received AAOx4 on unit. Compliant with meds. Presents as anxious with numerous somatic complaints. Needs met as appropriate, redirected as needed. Encouraged to participate in unit milieu and report concerns to staff as needed. Denies SI.HI.AVH. Will continue to monitor.

## 2016-10-17 MED ORDER — PANTOPRAZOLE SODIUM 40 MG PO TBEC
40.0000 mg | DELAYED_RELEASE_TABLET | Freq: Every day | ORAL | Status: DC
  Administered 2016-10-17 – 2016-10-23 (×7): 40 mg via ORAL
  Filled 2016-10-17 (×7): qty 1

## 2016-10-17 MED ORDER — CHLORDIAZEPOXIDE HCL 25 MG PO CAPS: 50.0000 mg | ORAL_CAPSULE | Freq: Three times a day (TID) | ORAL | Status: DC

## 2016-10-17 MED ORDER — CHLORDIAZEPOXIDE HCL 25 MG PO CAPS: 50.0000 mg | ORAL_CAPSULE | Freq: Four times a day (QID) | ORAL | Status: DC

## 2016-10-17 MED ORDER — CHLORDIAZEPOXIDE HCL 25 MG PO CAPS
25.0000 mg | ORAL_CAPSULE | Freq: Four times a day (QID) | ORAL | Status: DC
  Administered 2016-10-17 – 2016-10-18 (×3): 25 mg via ORAL
  Filled 2016-10-17 (×4): qty 1

## 2016-10-17 MED ORDER — OLANZAPINE 5 MG PO TABS
5.0000 mg | ORAL_TABLET | Freq: Every day | ORAL | Status: AC
  Administered 2016-10-17: 5 mg via ORAL
  Filled 2016-10-17: qty 1

## 2016-10-17 MED ORDER — DIPHENHYDRAMINE HCL 25 MG PO CAPS
50.0000 mg | ORAL_CAPSULE | Freq: Every day | ORAL | Status: DC
  Administered 2016-10-17 – 2016-10-22 (×5): 50 mg via ORAL
  Filled 2016-10-17 (×2): qty 2
  Filled 2016-10-17: qty 1
  Filled 2016-10-17 (×4): qty 2

## 2016-10-17 MED ORDER — LORATADINE 10 MG PO TABS
10.0000 mg | ORAL_TABLET | Freq: Every day | ORAL | Status: DC
  Administered 2016-10-17 – 2016-10-23 (×7): 10 mg via ORAL
  Filled 2016-10-17 (×7): qty 1

## 2016-10-17 NOTE — Progress Notes (Signed)
Recreation Therapy Notes  Date: 04.20.18 Time: 9:30 am Location: Craft Room  Group Topic: Coping Skills  Goal Area(s) Addresses:  Patient will participate in healthy coping skill. Patient will verbalize benefit of using art as a coping skill.  Behavioral Response: Did not attend   Intervention: Coloring  Activity: Patients were given coloring sheets to color and were instructed to think about the emotions they were feeling and what their minds were focused on.  Education: LRT educated patients on healthy coping skills.  Education Outcome: Patient did not attend group.  Clinical Observations/Feedback: Patient did not attend group.  Leonette Monarch, LRT/CTRS 10/17/2016 10:21 AM

## 2016-10-17 NOTE — Progress Notes (Signed)
Patient is labile, she is somatic, impulsive, no coping skills, no insight to her disorders, she has angry affect. Patient can be redirected and will calm her demeanor if empathy is shown. Patient talks about medications and how she cannot be tapered down, states "  I need my damn medications to stay as it is' patient is worried that she will get less medication and she states when she gets home she will drink if she does not have enough medications. Patient is in denial regarding her substance abuse problems.Nurse will continue to monitor.
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
## 2016-10-17 NOTE — Progress Notes (Signed)
Patient has been anxious and intrusive most of this evening.  Most interactions have been focused on medications or food and Patient conveys urgency in all requests/demands.  She had a review of her medications with writer around 2130, but after midnight, she seemed to have little recall of the content of that discussion.  She was surprised to find out she has no prn Librium for the "middle of the night".  She verbalized negative feelings about "the plan to put me away for 6 months in rehab"  And insists that she is not being "detoxed" correctly. She was encouraged to turn the lights down and was given Ensure.  She reassured staff "that nothing will help". As of 0120, she has not yet been asleep.

## 2016-10-17 NOTE — Tx Team (Addendum)
Interdisciplinary Treatment and Diagnostic Plan Update  10/17/2016 Time of Session: [place] MRN: [id number]  Principal Diagnosis: MDD (major depressive disorder), recurrent severe, without psychosis ([place])  Secondary Diagnoses: Principal Problem:   MDD (major depressive disorder), recurrent severe, without psychosis ([place]) Active Problems:   Borderline personality disorder   PTSD (post-traumatic stress disorder)   Pancreatitis   Alcohol use disorder, severe, dependence ([place])   Alcohol withdrawal ([place])   Opioid use disorder, severe, dependence ([place])   Opioid use with withdrawal ([place])   Tobacco use disorder   Cocaine use disorder, severe, dependence ([place])   Current Medications:  Current Facility-Administered Medications  Medication Dose Route Frequency Provider Last Rate Last Dose  . acetaminophen (TYLENOL) tablet 1,000 mg  1,000 mg Oral Q6H PRN Hildred Priest, MD   1,000 mg at 10/16/16 1436  . alum & mag hydroxide-simeth (MAALOX/MYLANTA) 200-200-20 MG/5ML suspension 30 mL  30 mL Oral Q4H PRN Hildred Priest, MD   30 mL at 10/17/16 0548  . buprenorphine-naloxone (SUBOXONE) 8-2 mg per SL tablet 1 tablet  1 tablet Sublingual Daily Hildred Priest, MD   1 tablet at 10/17/16 0751  . chlordiazePOXIDE (LIBRIUM) capsule 50 mg  50 mg Oral QID Hildred Priest, MD      . Derrill Memo ON 10/18/2016] chlordiazePOXIDE (LIBRIUM) capsule 50 mg  50 mg Oral TID Hildred Priest, MD      . diphenhydrAMINE (BENADRYL) capsule 50 mg  50 mg Oral QHS Hildred Priest, MD      . elvitegravir-cobicistat-emtricitabine-tenofovir (GENVOYA) 150-150-200-10 MG tablet 1 tablet  1 tablet Oral Daily Hildred Priest, MD   1 tablet at 10/17/16 0752  . estradiol (ESTRACE) tablet 0.5 mg  0.5 mg Oral Daily Hildred Priest, MD   0.5 mg at 10/17/16 0752  . feeding supplement (ENSURE ENLIVE) (ENSURE ENLIVE) liquid 237 mL  237 mL Oral TID BM Hildred Priest, MD   237 mL at 10/17/16 1100  . ibuprofen (ADVIL,MOTRIN) tablet 600 mg  600 mg Oral Q6H PRN Hildred Priest, MD   600 mg at 10/17/16 0756  . loperamide (IMODIUM) capsule 4 mg  4 mg Oral PRN Hildred Priest, MD      . magnesium hydroxide (MILK OF MAGNESIA) suspension 30 mL  30 mL Oral Daily PRN Hildred Priest, MD      . methocarbamol (ROBAXIN) tablet 500 mg  500 mg Oral Q8H PRN Hildred Priest, MD   500 mg at 10/17/16 0116  . nicotine (NICODERM CQ - dosed in mg/24 hours) patch 21 mg  21 mg Transdermal Daily Hildred Priest, MD   21 mg at 10/17/16 0752  . OLANZapine (ZYPREXA) tablet 5 mg  5 mg Oral QHS Hildred Priest, MD      . ondansetron (ZOFRAN-ODT) disintegrating tablet 4 mg  4 mg Oral Q8H PRN Hildred Priest, MD   4 mg at 10/16/16 1436   PTA Medications: Prescriptions Prior to Admission  Medication Sig Dispense Refill Last Dose  . elvitegravir-cobicistat-emtricitabine-tenofovir (GENVOYA) 150-150-200-10 MG TABS tablet Take 1 tablet by mouth daily. 28 tablet 0     Patient Stressors: Arts development officer issue Substance abuse Traumatic event  Patient Strengths: Ability for insight Capable of independent living General fund of knowledge Physical Health Supportive family/friends  Treatment Modalities: Medication Management, Group therapy, Case management,  1 to 1 session with clinician, Psychoeducation, Recreational therapy.   Physician Treatment Plan for Primary Diagnosis: MDD (major depressive disorder), recurrent severe, without psychosis ([place]) Long Term Goal(s): Improvement in symptoms so as ready for discharge  Improvement in symptoms so as ready for discharge   Short Term Goals: Ability to identify changes in lifestyle to reduce recurrence of condition will improve Ability to demonstrate self-control will improve Ability to identify and develop effective coping behaviors will  improve Ability to identify triggers associated with substance abuse/mental health issues will improve Ability to verbalize feelings will improve Ability to disclose and discuss suicidal ideas  Medication Management: Evaluate patient's response, side effects, and tolerance of medication regimen.  Therapeutic Interventions: 1 to 1 sessions, Unit Group sessions and Medication administration.  Evaluation of Outcomes: Not Met  Physician Treatment Plan for Secondary Diagnosis: Principal Problem:   MDD (major depressive disorder), recurrent severe, without psychosis ([place]) Active Problems:   Borderline personality disorder   PTSD (post-traumatic stress disorder)   Pancreatitis   Alcohol use disorder, severe, dependence ([facility])   Alcohol withdrawal ([place])   Opioid use disorder, severe, dependence ([place])   Opioid use with withdrawal ([place])   Tobacco use disorder   Cocaine use disorder, severe, dependence ([place])  Long Term Goal(s): Improvement in symptoms so as ready for discharge Improvement in symptoms so as ready for discharge   Short Term Goals: Ability to identify changes in lifestyle to reduce recurrence of condition will improve Ability to demonstrate self-control will improve Ability to identify and develop effective coping behaviors will improve Ability to identify triggers associated with substance abuse/mental health issues will improve Ability to verbalize feelings will improve Ability to disclose and discuss suicidal ideas     Medication Management: Evaluate patient's response, side effects, and tolerance of medication regimen.  Therapeutic Interventions: 1 to 1 sessions, Unit Group sessions and Medication administration.  Evaluation of Outcomes: Not Met   RN Treatment Plan for Primary Diagnosis: MDD (major depressive disorder), recurrent severe, without psychosis ([place]) Long Term Goal(s): Knowledge of disease and therapeutic regimen to maintain health will improve  Short Term  Goals: Ability to demonstrate self-control and Ability to participate in decision making will improve  Medication Management: RN will administer medications as ordered by provider, will assess and evaluate patient's response and provide education to patient for prescribed medication. RN will report any adverse and/or side effects to prescribing provider.  Therapeutic Interventions: 1 on 1 counseling sessions, Psychoeducation, Medication administration, Evaluate responses to treatment, Monitor vital signs and CBGs as ordered, Perform/monitor CIWA, COWS, AIMS and Fall Risk screenings as ordered, Perform wound care treatments as ordered.  Evaluation of Outcomes: Not Met   LCSW Treatment Plan for Primary Diagnosis: MDD (major depressive disorder), recurrent severe, without psychosis ([place]) Long Term Goal(s): Safe transition to appropriate next level of care at discharge, Engage patient in therapeutic group addressing interpersonal concerns.  Short Term Goals: Engage patient in aftercare planning with referrals and resources, Facilitate patient progression through stages of change regarding substance use diagnoses and concerns and Increase skills for wellness and recovery  Therapeutic Interventions: Assess for all discharge needs, 1 to 1 time with Social worker, Explore available resources and support systems, Assess for adequacy in community support network, Educate family and significant other(s) on suicide prevention, Complete Psychosocial Assessment, Interpersonal group therapy.  Evaluation of Outcomes: Not Met    Recreational Therapy Treatment Plan for Primary Diagnosis: MDD (major depressive disorder), recurrent severe, without psychosis ([place]) Long Term Goal(s): Patient will participate in recreation therapy treatment in at least 2 group sessions without prompting from LRT  Short Term Goals: Increase self-esteem, Increase healthy coping skills  Treatment Modalities: Group Therapy and  Individual Treatment Sessions  Therapeutic  Interventions: Psychoeducation  Evaluation of Outcomes: Progressing   Progress in Treatment: Attending groups: Yes. Participating in groups: Yes. Taking medication as prescribed: Yes. Toleration medication: Yes. Family/Significant other contact made: No, will contact:  mother Patient understands diagnosis: Yes. Discussing patient identified problems/goals with staff: Yes. Medical problems stabilized or resolved: Yes. Denies suicidal/homicidal ideation: Yes. Issues/concerns per patient self-inventory: No. Other: none  New problem(s) identified: No, Describe:  none  New Short Term/Long Term Goal(s):  Discharge Plan or Barriers: CSW assessing for appropriate plan.  Reason for Continuation of Hospitalization: Depression Medication stabilization  Estimated Length of Stay:5 days  Attendees: Patient: Pt declined to attend treatment team meeting. 10/17/2016   Physician: Dr. Jerilee Hoh, MD 10/17/2016   Nursing: Floyde Parkins, RN 10/17/2016   RN Care Manager: 10/17/2016   Social Worker: Lurline Idol, LCSW 10/17/2016   Recreational Therapist: Drue Flirt, LRT/CTRS  10/17/2016   Other:  10/17/2016   Other:  10/17/2016   Other: 10/17/2016        Scribe for Treatment Team: Joanne Chars, [place] 10/17/2016 2:52 PM

## 2016-10-17 NOTE — [facility] Group Notes (Signed)
[facility] LCSW Group Therapy Note  Date/Time: 10/17/16, 1300  Type of Therapy and Topic:  Group Therapy:  Feelings around Relapse and Recovery  Participation Level:  Did Not Attend   Mood:  Description of Group:    Patients in this group will discuss emotions they experience before and after a relapse. They will process how experiencing these feelings, or avoidance of experiencing them, relates to having a relapse. Facilitator will guide patients to explore emotions they have related to recovery. Patients will be encouraged to process which emotions are more powerful. They will be guided to discuss the emotional reaction significant others in their lives may have to patients' relapse or recovery. Patients will be assisted in exploring ways to respond to the emotions of others without this contributing to a relapse.  Therapeutic Goals: 1. Patient will identify two or more emotions that lead to relapse for them:  2. Patient will identify two emotions that result when they relapse:  3. Patient will identify two emotions related to recovery:  4. Patient will demonstrate ability to communicate their needs through discussion and/or role plays.   Summary of Patient Progress:     Therapeutic Modalities:   Cognitive Behavioral Therapy Solution-Focused Therapy Assertiveness Training Relapse Prevention Therapy  Lurline Idol, LCSW

## 2016-10-17 NOTE — [facility] Group Notes (Signed)
[facility] Group Notes:  (Nursing/MHT/Case Management/Adjunct)  Date:  10/17/2016  Time:  5:49 AM  Type of Therapy:  Psychoeducational Skills  Participation Level:  Active  Participation Quality:  Appropriate, Attentive and Sharing  Affect:  Appropriate  Cognitive:  Appropriate  Insight:  Appropriate and Good  Engagement in Group:  Engaged  Modes of Intervention:  Discussion, Socialization and Support  Summary of Progress/Problems:  Carrie Phillips 10/17/2016, 5:49 AM

## 2016-10-17 NOTE — Progress Notes (Signed)
Patient did go outside for a brief period, sits in rooms and cries on and off, cursing about medications to nurse, Patient labile, patient will cooperative with treatment plan, but will curse , rant and rave about multiple things.Nurse will continue to monitor.

## 2016-10-18 MED ORDER — BUPRENORPHINE HCL-NALOXONE HCL 2-0.5 MG SL SUBL
2.0000 | SUBLINGUAL_TABLET | Freq: Two times a day (BID) | SUBLINGUAL | Status: DC
  Administered 2016-10-19 – 2016-10-20 (×3): 2 via SUBLINGUAL
  Filled 2016-10-18 (×3): qty 2

## 2016-10-18 MED ORDER — TRAZODONE HCL 50 MG PO TABS
50.0000 mg | ORAL_TABLET | Freq: Every day | ORAL | Status: DC
  Administered 2016-10-19 – 2016-10-20 (×2): 50 mg via ORAL
  Filled 2016-10-18 (×3): qty 1

## 2016-10-18 MED ORDER — CHLORDIAZEPOXIDE HCL 25 MG PO CAPS
25.0000 mg | ORAL_CAPSULE | Freq: Three times a day (TID) | ORAL | Status: DC
  Administered 2016-10-19 – 2016-10-23 (×13): 25 mg via ORAL
  Filled 2016-10-18 (×14): qty 1

## 2016-10-18 MED ORDER — IBUPROFEN 800 MG PO TABS
800.0000 mg | ORAL_TABLET | Freq: Four times a day (QID) | ORAL | Status: DC | PRN
  Administered 2016-10-18 – 2016-10-22 (×8): 800 mg via ORAL
  Filled 2016-10-18 (×8): qty 1

## 2016-10-18 NOTE — Progress Notes (Signed)
D: Pt denies SI/HI/AVH. Pt was sleep most of the evening, but when pt woke up pt was loud, somatic and drowsy. Pt focused on medications, pt asked for her Suboxone . Pt refused her Trazodone: pt stated it makes her feel drowsy all day after taking it.   A: Pt was offered support and encouragement. Pt was given scheduled medications. Pt was encourage to attend groups. Q 15 minute checks were done for safety.   R: safety maintained on unit.

## 2016-10-18 NOTE — Plan of Care (Signed)
Problem: Safety: Goal: Ability to remain free from injury will improve Outcome: Progressing Pt safe on the unit at this time   
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
## 2016-10-18 NOTE — Plan of Care (Signed)
Problem: Activity: Goal: Imbalance in normal sleep/wake cycle will improve Outcome: Not Progressing Slept poorly. Reported sleep 3 hours 45 minutes. Up and out of bed multiple times, redirection required.

## 2016-10-18 NOTE — Progress Notes (Signed)
Pt continues to come to nurse's station complaining about medication changes. Unsteady on feet, can barely hold eyes open, speaking with slow, slurred speech. Confused on how to get back to her room. Pt instructed to go to her room and lie down in her bed multiple times. Nurse even walked pt to her room multiple times. Pt not redirectable, needs constant redirection. Safety maintained. Will continue to monitor.

## 2016-10-18 NOTE — Progress Notes (Signed)
Denies Si/HI/AVH.  Patient drowsy. Verbalizes that she is detoxing and that she hurts all over.  Speech is rapid and loud and tangential.  Patient is intrusive and somatic.  Support offered.  Safety checks maintained.

## 2016-10-18 NOTE — Plan of Care (Signed)
Problem: Bowel/Gastric: Goal: Will not experience complications related to bowel motility Outcome: Not Progressing Pt complains constantly of gastric upset, nausea. PRN medications available if needed.

## 2016-10-18 NOTE — Progress Notes (Signed)
Pt irate, disrupting unit all night. Fixated on medication changes, stating "I can't believe she lowered my librium dose. I have never been through detox this fast, and yall are just setting me up for failure." Pt only slept about 3 hours and was up in hallway talking loudly, stating same thing over and over about issues with medication changes. Pt also reporting that she spoke with her mother on the phone and her mother is reporting she is turning pt's 95 yr old over to [facility] custody. Pt constantly blaming others. Pt requests multiple times for clothes because "I don't have any." Pt then stated, "I guess I'll just have to wash the one's I have." Pt very labile. Medication compliant with encouragement. Support and encouragement provided.  Medications administered as ordered with education. Safety maintained with every 15 minute checks. Will continue to monitor.

## 2016-10-18 NOTE — BHH Group Notes (Signed)
[facility] Group Notes:  (Nursing/MHT/Case Management/Adjunct)  Date:  10/18/2016  Time:  12:31 AM  Type of Therapy:    Participation Level:    Participation Quality:  Appropriate  Affect:  Appropriate  Cognitive:  Appropriate  Insight:  Appropriate  Engagement in Group:  Engaged  Modes of Intervention:  Activity  Summary of Progress/Problems:  Carrie Phillips 10/18/2016, 12:31 AM

## 2016-10-18 NOTE — BHH Group Notes (Signed)
[facility] LCSW Group Therapy  10/18/2016 2:19 PM  Type of Therapy:  Group Therapy  Participation Level:  Minimal  Participation Quality:  Drowsy and Inattentive  Affect:  Flat and Lethargic  Cognitive:  Disorganized and Lacking  Insight:  None  Engagement in Therapy:  Limited  Modes of Intervention:  Activity, Discussion, Education, Problem-solving, Art therapist and Support  Summary of Progress/Problems: Safety Planning: Patients identified fears or worries surrounding discharge. Patients offered support to their peers and openly developed safety plans for their individual needs. Patients developed their own safety plan. Patients discussed their warning signs, coping strategies, support system with family and friends, identified mental health professionals, and how to keep their environments safe (ex. Removing unnecessary medications or removing weapons/guns). Patients then discussed their personalized safety plan with the group. Patient had difficulty participating in group due to falling asleep and stating "I just don't feel right". Patient seem groggy, sedated and stated she was too drowsy to remain in group. Patient left after about 15 minutes and did not return to group.   Lareta Bruneau G. [place], [place] 10/18/2016, 2:23 PM

## 2016-10-19 NOTE — BH Assessment (Deleted)
Pt too lethargic this evening, so night medications were held

## 2016-10-19 NOTE — Progress Notes (Signed)
Pt got up stating she was having chest pains when she breaths in deeply. Pt then began to start yelling that she knew what medications she needed to be on and that they do not make her lethargic, it must be something we are giving her. Pt was talking very loud not showing signs of any chest pains. Pt was given prn tylenol per MAR.

## 2016-10-19 NOTE — BHH Group Notes (Signed)
[facility] LCSW Group Therapy  10/19/2016 5:41 PM  Type of Therapy:  Group Therapy  Participation Level:  Active  Participation Quality:  Sharing  Affect:  Anxious, Irritable and Tearful  Cognitive:  Disorganized  Insight:  Limited  Engagement in Therapy:  Poor  Modes of Intervention:  Discussion, Education, Problem-solving, Reality Testing, Socialization and Support  Summary of Progress/Problems: Recognizing Triggers: Patients defined triggers and discussed the importance of recognizing their personal warning signs. Patients identified their own triggers and how they tend to cope with stressful situations. Patients discussed areas such as people, places, things, and thoughts that rigger certain emotions for them. CSW provided support to patients and discussed safety planning for when these triggers occur. Group participants had opportunities to share openly with the group and participate in a group discussion while providing support and feedback to their peers. Patient became tearful during group stating she could not returned home without inpatient treatment for substance use. CSW provided support to patient and discussed ways to maintain sobriety.   Mckynzi Cammon G. [place], [place] 10/19/2016, 5:43 PM

## 2016-10-19 NOTE — Progress Notes (Signed)
Denies SI/HI/AVH.  Rates depression as a 8/10 and anxiety as 10/10.  Patient is loud and fixated on her medications.  During evening medication pass patient became upset after being informed that she did not have anything scheduled at this time.  Asked about librium and suboxone.  Informed that suboxone due at 8pm and librium due at 10 pm.  Patient becomes upset and storms out of medication room.  Returns to her room.  This Probation officer hears patient in hall talking loudly to another staff member stating "They won't give me my fucking medication"  This writer proceeds to see what is happening and asked the patient to return to her room.  Before proceeding to the room after the patient another nurse informed this writer that patient was just in her room throwing things.  Proceeded to the patient room and asked what is going on.  Patient started talking loudly stating that she hurts all over and that she is detoxing and she does not understand why she cannot have her medications.  Explained to the patient that he has her medications and that librium is tapered for detox and that she is still on her suboxone.  Patient states that the doctor is tapering to fast.  Asked if she does not trust what the doctor is doing.  Patient states "No one understands what type of pain I am in, it is not just from detoxing."  Allowed patient to vent.  This Probation officer then informed patient that she cannot throw property around in the room and that she cannot be loud and disrespectful in the milieu.  Requested for patient to remain in her room until she calms down.  Patient complied.  After approximately 30 minutes returned to patient room and asked if she was calm.  Patient verbalized that she was and this Probation officer informed patient that she could return to the milieu.

## 2016-10-19 NOTE — Plan of Care (Signed)
Problem: Safety: Goal: Ability to remain free from injury will improve Outcome: Progressing Pt safe on the unit at this time   

## 2016-10-19 NOTE — Progress Notes (Signed)
D: Pt denies SI/HI/AVH. Pt is pleasant and cooperative. Pt has been appropriate with writer this evening. Pt tries to staff split and talk about other staff, but writer explained that was not acceptable. Pt seen on the unit interacting with peers, pt loud at times, but has been appropriate with Probation officer, pt seen complaining to there staff at times.   A: Pt was offered support and encouragement. Pt was given scheduled medications. Pt was encourage to attend groups. Q 15 minute checks were done for safety.   R:Pt attends groups and interacts well with peers and staff. Pt is taking medication. Pt receptive to treatment and safety maintained on unit.

## 2016-10-19 NOTE — Progress Notes (Signed)
Pt just wants to complain about something, pt was given a pen to fill out her self inventory pt stated "the pens that don't write". Pt began trying to write very hard and Probation officer took a pen and showed her that they do work, Probation officer informed pt she does not have to press so hard to write

## 2016-10-19 NOTE — Progress Notes (Signed)
Pt too lethargic this evening, so night medications were held
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
## 2016-10-20 MED ORDER — OLANZAPINE 5 MG PO TABS
2.5000 mg | ORAL_TABLET | Freq: Every day | ORAL | Status: DC
  Administered 2016-10-20 – 2016-10-22 (×3): 2.5 mg via ORAL
  Filled 2016-10-20 (×4): qty 1

## 2016-10-20 MED ORDER — BUPRENORPHINE HCL-NALOXONE HCL 8-2 MG SL SUBL
1.0000 | SUBLINGUAL_TABLET | Freq: Every day | SUBLINGUAL | Status: DC
  Administered 2016-10-21 – 2016-10-23 (×3): 1 via SUBLINGUAL
  Filled 2016-10-20 (×3): qty 1

## 2016-10-20 MED ORDER — BUPRENORPHINE HCL-NALOXONE HCL 2-0.5 MG SL SUBL
3.0000 | SUBLINGUAL_TABLET | Freq: Once | SUBLINGUAL | Status: AC
  Administered 2016-10-20: 3 via SUBLINGUAL
  Filled 2016-10-20: qty 3
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
## 2016-10-20 NOTE — Tx Team (Signed)
Interdisciplinary Treatment and Diagnostic Plan Update  10/20/2016 Time of Session: [place] MRN: [id number]  Principal Diagnosis: MDD (major depressive disorder), recurrent severe, without psychosis ([place])  Secondary Diagnoses: Principal Problem:   MDD (major depressive disorder), recurrent severe, without psychosis ([place]) Active Problems:   Borderline personality disorder   PTSD (post-traumatic stress disorder)   Pancreatitis   Alcohol use disorder, severe, dependence ([place])   Alcohol withdrawal ([place])   Opioid use disorder, severe, dependence ([place])   Opioid use with withdrawal ([place])   Tobacco use disorder   Cocaine use disorder, severe, dependence ([place])   Current Medications:  Current Facility-Administered Medications  Medication Dose Route Frequency Provider Last Rate Last Dose  . acetaminophen (TYLENOL) tablet 1,000 mg  1,000 mg Oral Q6H PRN Hildred Priest, MD   1,000 mg at 10/19/16 0558  . alum & mag hydroxide-simeth (MAALOX/MYLANTA) 200-200-20 MG/5ML suspension 30 mL  30 mL Oral Q4H PRN Hildred Priest, MD   30 mL at 10/17/16 0548  . [START ON 10/21/2016] buprenorphine-naloxone (SUBOXONE) 8-2 mg per SL tablet 1 tablet  1 tablet Sublingual Daily Hildred Priest, MD      . chlordiazePOXIDE (LIBRIUM) capsule 25 mg  25 mg Oral TID Lenward Chancellor, MD   25 mg at 10/20/16 1224  . diphenhydrAMINE (BENADRYL) capsule 50 mg  50 mg Oral QHS Hildred Priest, MD   50 mg at 10/19/16 2151  . elvitegravir-cobicistat-emtricitabine-tenofovir (GENVOYA) 150-150-200-10 MG tablet 1 tablet  1 tablet Oral Daily Hildred Priest, MD   1 tablet at 10/20/16 [phone]  . estradiol (ESTRACE) tablet 0.5 mg  0.5 mg Oral Daily Hildred Priest, MD   0.5 mg at 10/20/16 0810  . feeding supplement (ENSURE ENLIVE) (ENSURE ENLIVE) liquid 237 mL  237 mL Oral TID BM Hildred Priest, MD   237 mL at 10/20/16 1224  . ibuprofen (ADVIL,MOTRIN) tablet 800  mg  800 mg Oral Q6H PRN Lenward Chancellor, MD   800 mg at 10/20/16 0842  . loratadine (CLARITIN) tablet 10 mg  10 mg Oral Daily Hildred Priest, MD   10 mg at 10/20/16 0810  . magnesium hydroxide (MILK OF MAGNESIA) suspension 30 mL  30 mL Oral Daily PRN Hildred Priest, MD   30 mL at 10/20/16 0526  . nicotine (NICODERM CQ - dosed in mg/24 hours) patch 21 mg  21 mg Transdermal Daily Hildred Priest, MD   21 mg at 10/20/16 0811  . OLANZapine (ZYPREXA) tablet 2.5 mg  2.5 mg Oral QHS Hildred Priest, MD      . ondansetron (ZOFRAN-ODT) disintegrating tablet 4 mg  4 mg Oral Q8H PRN Hildred Priest, MD   4 mg at 10/20/16 1224  . pantoprazole (PROTONIX) EC tablet 40 mg  40 mg Oral Daily Hildred Priest, MD   40 mg at 10/20/16 0810  . traZODone (DESYREL) tablet 50 mg  50 mg Oral QHS Lenward Chancellor, MD   50 mg at 10/19/16 2151   PTA Medications: Prescriptions Prior to Admission  Medication Sig Dispense Refill Last Dose  . elvitegravir-cobicistat-emtricitabine-tenofovir (GENVOYA) 150-150-200-10 MG TABS tablet Take 1 tablet by mouth daily. 28 tablet 0     Patient Stressors: Arts development officer issue Substance abuse Traumatic event  Patient Strengths: Ability for insight Capable of independent living General fund of knowledge Physical Health Supportive family/friends  Treatment Modalities: Medication Management, Group therapy, Case management,  1 to 1 session with clinician, Psychoeducation, Recreational therapy.   Physician Treatment Plan for Primary Diagnosis: MDD (major depressive disorder), recurrent severe, without psychosis (  [place]) Long Term Goal(s): Improvement in symptoms so as ready for discharge Improvement in symptoms so as ready for discharge   Short Term Goals: Ability to identify changes in lifestyle to reduce recurrence of condition will improve Ability to demonstrate self-control will improve Ability to  identify and develop effective coping behaviors will improve Ability to identify triggers associated with substance abuse/mental health issues will improve Ability to verbalize feelings will improve Ability to disclose and discuss suicidal ideas  Medication Management: Evaluate patient's response, side effects, and tolerance of medication regimen.  Therapeutic Interventions: 1 to 1 sessions, Unit Group sessions and Medication administration.  Evaluation of Outcomes: Progressing  Physician Treatment Plan for Secondary Diagnosis: Principal Problem:   MDD (major depressive disorder), recurrent severe, without psychosis ([place]) Active Problems:   Borderline personality disorder   PTSD (post-traumatic stress disorder)   Pancreatitis   Alcohol use disorder, severe, dependence ([facility])   Alcohol withdrawal ([place])   Opioid use disorder, severe, dependence ([place])   Opioid use with withdrawal ([place])   Tobacco use disorder   Cocaine use disorder, severe, dependence ([place])  Long Term Goal(s): Improvement in symptoms so as ready for discharge Improvement in symptoms so as ready for discharge   Short Term Goals: Ability to identify changes in lifestyle to reduce recurrence of condition will improve Ability to demonstrate self-control will improve Ability to identify and develop effective coping behaviors will improve Ability to identify triggers associated with substance abuse/mental health issues will improve Ability to verbalize feelings will improve Ability to disclose and discuss suicidal ideas     Medication Management: Evaluate patient's response, side effects, and tolerance of medication regimen.  Therapeutic Interventions: 1 to 1 sessions, Unit Group sessions and Medication administration.  Evaluation of Outcomes: Progressing   RN Treatment Plan for Primary Diagnosis: MDD (major depressive disorder), recurrent severe, without psychosis ([place]) Long Term Goal(s): Knowledge of disease and  therapeutic regimen to maintain health will improve  Short Term Goals: Ability to demonstrate self-control and Ability to participate in decision making will improve  Medication Management: RN will administer medications as ordered by provider, will assess and evaluate patient's response and provide education to patient for prescribed medication. RN will report any adverse and/or side effects to prescribing provider.  Therapeutic Interventions: 1 on 1 counseling sessions, Psychoeducation, Medication administration, Evaluate responses to treatment, Monitor vital signs and CBGs as ordered, Perform/monitor CIWA, COWS, AIMS and Fall Risk screenings as ordered, Perform wound care treatments as ordered.  Evaluation of Outcomes: Progressing   LCSW Treatment Plan for Primary Diagnosis: MDD (major depressive disorder), recurrent severe, without psychosis ([place]) Long Term Goal(s): Safe transition to appropriate next level of care at discharge, Engage patient in therapeutic group addressing interpersonal concerns.  Short Term Goals: Engage patient in aftercare planning with referrals and resources, Facilitate patient progression through stages of change regarding substance use diagnoses and concerns and Increase skills for wellness and recovery  Therapeutic Interventions: Assess for all discharge needs, 1 to 1 time with Social worker, Explore available resources and support systems, Assess for adequacy in community support network, Educate family and significant other(s) on suicide prevention, Complete Psychosocial Assessment, Interpersonal group therapy.  Evaluation of Outcomes: Progressing    Recreational Therapy Treatment Plan for Primary Diagnosis: MDD (major depressive disorder), recurrent severe, without psychosis ([place]) Long Term Goal(s): Patient will participate in recreation therapy treatment in at least 2 group sessions without prompting from LRT  Short Term Goals: Increase self-esteem, Increase  healthy coping skills  Treatment Modalities:  Group Therapy and Individual Treatment Sessions  Therapeutic Interventions: Psychoeducation  Evaluation of Outcomes: Progressing   Progress in Treatment: Attending groups: Yes. Participating in groups: Yes. Taking medication as prescribed: Yes. Toleration medication: Yes. Family/Significant other contact made: No, will contact:  mother Patient understands diagnosis: Yes. Discussing patient identified problems/goals with staff: Yes. Medical problems stabilized or resolved: Yes. Denies suicidal/homicidal ideation: Yes. Issues/concerns per patient self-inventory: No. Other: none  New problem(s) identified: No, Describe:  none  New Short Term/Long Term Goal(s): Pt goal: "I need to go to rehab and to learn how to stay calm."  Discharge Plan or Barriers: CSW assessing for appropriate plan.  Reason for Continuation of Hospitalization: Depression Medication stabilization  Estimated Length of Stay:5 days  Attendees: Patient: Carrie Phillips 10/20/2016  Physician: Dr. Jerilee Hoh, MD 10/20/2016   Nursing: Floyde Parkins, RN 10/20/2016   RN Care Manager: 10/20/2016   Social Worker: Lurline Idol, LCSW 10/20/2016   Recreational Therapist: Drue Flirt, LRT/CTRS  10/20/2016   Other:  10/17/2016   Other:  10/17/2016   Other: 10/17/2016        Scribe for Treatment Team: Joanne Chars, [place] 10/20/2016 2:54 PM
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
## 2016-10-20 NOTE — Progress Notes (Signed)
Patient left Dr. Eugenie Norrie office and began to rant in her room, "I might as well die! They are lying to me!"  Patient was loud and could be heard at the nurse's station.  This nurse and Dr. Lemmie Evens came to her room to discuss her behavior.  She was informed by Dr. Lemmie Evens that her yelling will get the other patient's upset.  Also she was informed that her subxone was switched back to 8 mg.  Patient had 2 mg this morning and Dr. Lemmie Evens ordered an addition 6 mg once.  Patient was also upset over her zyprexa and stating the nurses were informing the Dr that she was sedated.  Patient calmed down eventually and pharmacy was contacted by this nurse for the additional suboxone.  Patient came to nurse's station to request to speak to someone in charge.  Charge nurse was in a meeting, so Surveyor, quantity Marliss Coots) came on the unit to speak with her.  Patient was given her additional 6 mg while standing in the medication room with Belinda.  Patient is venting about the doctor over the weekend dropping her dose of suboxone.  She states, "I've never dropped my suboxone that quick.  I used to take 180 mg of methodone!"  Patient spoke about coping skills.  She states she likes to color.  Also offered to assist her with meditation in her room if she was agreeable.

## 2016-10-20 NOTE — Progress Notes (Signed)
D: Patient c/o nightmares, waking up in a sweat.  Patient states, "they felt so real.  You know I have PTSD.  I want to shave my legs today.  Can you promise me that?"  Informed patient that group was at 0900 and that possibly midmorning someone could observe her with the razor.  Patient also states, "No one has ever let me know my lab results.  I need to talk to the doctor.  I'm also coughing up blood."  The nurse from the night shift stated she coughed up "something red", however, did not think it was actually blood.  Patient states her pain is "always a 10/10.  The subutex tastes like battery acid."  Patient also states that she wants a different case worker.  She continues to voice numerous complaints, somatic and regarding staff.  She continues to staff split and has been informed that behavior is not appropriate.  Her voice is loud and pressured.  She has poor insight.  She states she was residential treatment somewhere in [place].  She has voiced passive thoughts of self harm.  She denies HI and does not appear to be responding to internal stimuli. A: Continue to monitor medication management and MD orders.  Safety checks continued every 15 minutes per protocol.  Offer support and encouragement as needed. R: Patient needs redirection with her behavior.  She can be loud and intrusive; her mood is labile.

## 2016-10-20 NOTE — B[facility] Group Notes (Signed)
[facility] LCSW Group Therapy  10/20/2016 2:28 PM  Type of Therapy:  Group Therapy  Participation Level:  Active  Participation Quality:  Attentive  Affect:  Appropriate  Cognitive:  Alert and Oriented  Insight:  Improving  Engagement in Therapy:  Engaged  Modes of Intervention:  Discussion, Education, Exploration, Problem-solving, Socialization and Support  Summary of Progress/Problems: Today's Topic: Overcoming Obstacles. Patients identified one short term goal and potential obstacles in reaching this goal. Patients processed barriers involved in overcoming these obstacles. Patients identified steps necessary for overcoming these obstacles and explored motivation (internal and external) for facing these difficulties head on. Carrie Phillips was attentive and engaged during today's processing group.  She shared that her biggest obstacle is "talking things out calmly insight of letting my emotions control my behavior." Carrie Phillips talked about throwing a garbage can when frustrated at a staff member yesterday and explored ways to better handle her frustrations. Carrie Phillips was receptive to ideas presented to her by other group members and CSW and stated that she would practice these skills while in the hospital.   Kimber Relic Smart LCSW 10/20/2016, 2:28 PM

## 2016-10-20 NOTE — Progress Notes (Signed)
Pt BF brought in some things for the pt . Pt had conditioner with ETOH in the 1st 3 ingredients so she was informed that she could not have the conditioner on the unit. Pt was given a small amount and was told this was a one time thing and we would not be able to go back into the locker to get more. Pt was informed to get someone to bring in the appropriate type of conditioner.

## 2016-10-20 NOTE — Progress Notes (Signed)
Recreation Therapy Notes  Date: 04.23.18 Time: 9:30 am Location: Craft Room  Group Topic: Self-expression  Goal Area(s) Addresses:  Patient will be able to identify a color that represents each emotion. Patient will verbalize benefit of using art as a means of self-expression. Patient will verbalize one emotion experienced while participating in activity.  Behavioral Response: Attentive, Interactive  Intervention: The Colors Within Me  Activity: Patients were given a blank face worksheet and were instructed to pick a color for each emotion and show on the worksheet how much of the emotion they were feeling.  Education: LRT educated patients on other forms of self-expression.  Education Outcome: Acknowledges education/In group clarification offered  Clinical Observations/Feedback: Patient worked on Radio producer. Patient talked about how depressing she felt the music was. Peers told her they did not think it was depressing. Patient contributed to group discussion by stating what emotions she was feeling.  Leonette Monarch, LRT/CTRS 10/20/2016 10:15 AM

## 2016-10-20 NOTE — [facility] Group Notes (Signed)
[facility] Group Notes:  (Nursing/MHT/Case Management/Adjunct)  Date:  10/20/2016  Time:  4:17 PM  Type of Therapy:  Psychoeducational Skills  Participation Level:  Active  Participation Quality:  Attentive and Intrusive  Affect:  Appropriate  Cognitive:  Appropriate  Insight:  Improving  Engagement in Group:  Engaged and Supportive  Modes of Intervention:  Discussion and Education  Summary of Progress/Problems:  Charise Killian 10/20/2016, 4:17 PM

## 2016-10-20 NOTE — Progress Notes (Signed)
Patient tearful stating, "they aren't going to discharge me today, are they?  I can't leave.  I'll die if I leave."  Patient states that she talked to the [facility] and they have a waiting list of 4 weeks.  Patient states, "I can't stay here for 4 weeks.  I know people have stayed here for 4 weeks before."  Explained to patient that 4 weeks was not a typical length of stay here.  Patient states that her mother is threatening to call DSS because she is caring for patient's 55 year old daughter.  She states, "I got her back one time.  I guess I'll have to do it again."

## 2016-10-20 NOTE — Progress Notes (Cosign Needed)
CSW spent significant time with pt calling several residential substance abuse providers and then calling medicaid in [place]. We were able to determine that pt does have active medicaid in [place].  Pt completed intake phone interview with Ronny Bacon at [facility] [phone]) and has been added to the waiting list.   Winferd Humphrey, MSW, LCSW Clinical Social Worker 10/20/2016 2:49 PM

## 2016-10-20 NOTE — Progress Notes (Signed)
Pt continues to be labile and presenting with med seeking behaviors. Pt woke up stating " what can I have"

## 2016-10-21 LAB — BASIC METABOLIC PANEL
Anion gap: 6 (ref 5–15)
BUN: 8 mg/dL (ref 6–20)
CHLORIDE: 95 mmol/L — AB (ref 101–111)
CO2: 32 mmol/L (ref 22–32)
Calcium: 8.9 mg/dL (ref 8.9–10.3)
Creatinine, Ser: 0.62 mg/dL (ref 0.44–1.00)
GFR calc Af Amer: >60 mL/min (ref >60)
GFR calc non Af Amer: >60 mL/min (ref >60)
GLUCOSE: 107 mg/dL — AB (ref 65–99)
POTASSIUM: 3.9 mmol/L (ref 3.5–5.1)
Sodium: 133 mmol/L — ABNORMAL LOW (ref 135–145)

## 2016-10-21 MED ORDER — FLUTICASONE PROPIONATE 50 MCG/ACT NA SUSP
2.0000 | Freq: Every day | NASAL | Status: DC
  Administered 2016-10-22 – 2016-10-23 (×2): 2 via NASAL
  Filled 2016-10-21: qty 16

## 2016-10-21 MED ORDER — PRAZOSIN HCL 1 MG PO CAPS
1.0000 mg | ORAL_CAPSULE | Freq: Every day | ORAL | Status: DC
  Administered 2016-10-21 – 2016-10-22 (×2): 1 mg via ORAL
  Filled 2016-10-21 (×2): qty 1

## 2016-10-21 NOTE — Progress Notes (Signed)
D: Pt passive SI, denies HI/ AVH. Pt is pleasant and cooperative most of the evening. Pt stated she has a bed waiting in [place] in 4 weeks, so she will have to stay with her BF until them. Pt said she had a good day she got a lot of information from her groups.   A: Pt was offered support and encouragement. Pt was given scheduled medications. Pt was encourage to attend groups. Q 15 minute checks were done for safety.    R:Pt attends groups and interacts well with peers and staff. Pt is taking medication. Pt receptive to treatment and safety maintained on unit.

## 2016-10-21 NOTE — Plan of Care (Signed)
Problem: Safety: Goal: Ability to identify and utilize support systems that promote safety will improve Outcome: Progressing Pt stated she will be staying with her BF before she goes to Tx facility in [place]

## 2016-10-21 NOTE — Progress Notes (Signed)
Denies SI/HI/AVH.  Rates depression, anxiety hopelessness as 10/10.  Less irritable and labile today.  Continues to have loud tone when talking.  Visible in the milieu.  No inappropriate behavior noted.  Scheduled medications given.  Safety checks maintained.  Support offered.

## 2016-10-21 NOTE — Progress Notes (Signed)
Recreation Therapy Notes  Date: 04.24.18 Time: 9:30 am Location: Craft Room  Group Topic: Goal Setting  Goal Area(s) Addresses:  Patient will write one goal. Patient will write at least one supportive statement.  Behavioral Response: Attentive, Interactive  Intervention: Step By Step  Activity: Patients were given a worksheet with a foot on it. Patients were instructed to write their goal inside the foot and write supportive statements outside of the foot.  Education: LRT educated patients on healthy ways they can celebrate reaching their goals.  Education Outcome: Acknowledges education/In group clarification offered   Clinical Observations/Feedback: Patient wrote goal and positive statements. Patient contributed to group discussion by stating it was easy to think of a goal, it was harder to think of positive statements and why, and that it is important to celebrate reaching their goals.  Leonette Monarch, LRT/CTRS 10/21/2016 10:14 AM

## 2016-10-21 NOTE — [facility] Group Notes (Signed)
[place] LCSW Group Therapy 10/21/2016 1:15 PM  Type of Therapy: Group Therapy- Feelings about Diagnosis  Participation Level: Active   Participation Quality:  Appropriate but Monopolizing at times  Affect:  Appropriate  Cognitive: Alert and Oriented   Insight:  Developing   Engagement in Therapy: Developing/Improving and Engaged   Modes of Intervention: Clarification, Confrontation, Discussion, Education, Exploration, Limit-setting, Orientation, Problem-solving, Rapport Building, Art therapist, Socialization and Support  Description of Group:   This group will allow patients to explore their thoughts and feelings about diagnoses they have received. Patients will be guided to explore their level of understanding and acceptance of these diagnoses. Facilitator will encourage patients to process their thoughts and feelings about the reactions of others to their diagnosis, and will guide patients in identifying ways to discuss their diagnosis with significant others in their lives. This group will be process-oriented, with patients participating in exploration of their own experiences as well as giving and receiving support and challenge from other group members.  Summary of Progress/Problems:  Pt was monopolizing in group discussion, but content was appropriate. Pt describes her frustration with finding dual diagnosis treatment. She reports long hx of alcohol abuse and mental health symptoms. Pt reports that her impulsivity is a major concern as it leads to poor decision making.  Therapeutic Modalities:   Cognitive Behavioral Therapy Solution Focused Therapy Motivational Interviewing Relapse Prevention Therapy  Adriana Reams, LCSW 10/21/2016 4:23 PM

## 2016-10-21 NOTE — B[facility] Group Notes (Signed)
[facility] Group Notes:  (Nursing/MHT/Case Management/Adjunct)  Date:  10/21/2016  Time:  2:07 PM  Type of Therapy:  Psychoeducational Skills  Participation Level:  Active  Participation Quality:  Appropriate, Attentive and Sharing  Affect:  Appropriate  Cognitive:  Appropriate and Oriented  Insight:  Appropriate  Engagement in Group:  Engaged  Modes of Intervention:  Discussion and Education  Summary of Progress/Problems:  Carrie Phillips 10/21/2016, 2:07 PM

## 2016-10-21 NOTE — BHH Group Notes (Signed)
[facility] Group Notes:  (Nursing/MHT/Case Management/Adjunct)  Date:  10/21/2016  Time:  12:46 AM  Type of Therapy:  Group Therapy  Participation Level:  Active  Participation Quality:  Appropriate  Affect:  Appropriate  Cognitive:  Appropriate  Insight:  Good  Engagement in Group:  Engaged  Modes of Intervention:  Activity  Summary of Progress/Problems:  Carrie Phillips 10/21/2016, 12:46 AM
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
## 2016-10-22 MED ORDER — ELVITEG-COBIC-EMTRICIT-TENOFAF 150-150-200-10 MG PO TABS: 1.0000 | ORAL_TABLET | Freq: Every day | ORAL | 9 refills | Status: DC

## 2016-10-22 MED ORDER — OLANZAPINE 2.5 MG PO TABS: 2.5000 mg | ORAL_TABLET | Freq: Every day | ORAL | 0 refills | Status: AC

## 2016-10-22 MED ORDER — POLYETHYLENE GLYCOL 3350 17 G PO PACK
17.0000 g | PACK | Freq: Two times a day (BID) | ORAL | Status: DC
  Administered 2016-10-22 – 2016-10-23 (×2): 17 g via ORAL
  Filled 2016-10-22 (×2): qty 1

## 2016-10-22 MED ORDER — ELVITEG-COBIC-EMTRICIT-TENOFAF 150-150-200-10 MG PO TABS: 1.0000 | ORAL_TABLET | Freq: Every day | ORAL | 0 refills | Status: AC

## 2016-10-22 MED ORDER — PRAZOSIN HCL 1 MG PO CAPS: 1.0000 mg | ORAL_CAPSULE | Freq: Every day | ORAL | 0 refills | Status: AC

## 2016-10-22 MED ORDER — ESTRADIOL 0.5 MG PO TABS: 0.5000 mg | ORAL_TABLET | Freq: Every day | ORAL | 0 refills | Status: AC

## 2016-10-22 MED ORDER — POLYETHYLENE GLYCOL 3350 17 G PO PACK
17.0000 g | PACK | Freq: Once | ORAL | Status: AC
  Administered 2016-10-22: 17 g via ORAL
  Filled 2016-10-22: qty 1

## 2016-10-22 MED ORDER — TRAMADOL HCL 50 MG PO TABS: 50.0000 mg | ORAL_TABLET | Freq: Three times a day (TID) | ORAL | 0 refills | Status: AC
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
## 2016-10-22 NOTE — Progress Notes (Signed)
Reports poor sleep last night with the help of sleep medication. Reports poor appetite, hyper energy, poor concentration. Rates depression 5/10, hopelessness 2/10, anxiety 10/10 (low 0-10 high). Denies SI/HI/AVH. Complains of constipation-PRN medication given as ordered, prune juice provided. Goal today is "getting everything planned out for my discharge" by "chase down greg." Writes, "please don't be mean to me I am really trying and this is very hard." Pt is anxious, demanding, needy. Requesting information on tramadol, which pt states she will be provided prescription for at discharge. Pt is medication complaint. Support and encouragement provided. Medications given as ordered with education. Safety maintained with every 15 minute checks. Will continue to monitor.
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
## 2016-10-22 NOTE — Plan of Care (Signed)
Problem: Coping: Goal: Ability to verbalize feelings will improve Outcome: Progressing Pt very open with thoughts and feelings, but sometimes requires redirection for being needy, demanding.

## 2016-10-22 NOTE — Tx Team (Signed)
Interdisciplinary Treatment and Diagnostic Plan Update  10/22/2016 Time of Session: [place] MRN: [id number]  Principal Diagnosis: MDD (major depressive disorder), recurrent severe, without psychosis ([place])  Secondary Diagnoses: Principal Problem:   MDD (major depressive disorder), recurrent severe, without psychosis ([place]) Active Problems:   Borderline personality disorder   PTSD (post-traumatic stress disorder)   Pancreatitis   Alcohol use disorder, severe, dependence ([place])   Alcohol withdrawal ([place])   Opioid use disorder, severe, dependence ([place])   Opioid use with withdrawal ([place])   Tobacco use disorder   Cocaine use disorder, severe, dependence ([place])   Current Medications:  Current Facility-Administered Medications  Medication Dose Route Frequency Provider Last Rate Last Dose  . acetaminophen (TYLENOL) tablet 1,000 mg  1,000 mg Oral Q6H PRN Hildred Priest, MD   1,000 mg at 10/22/16 0226  . alum & mag hydroxide-simeth (MAALOX/MYLANTA) 200-200-20 MG/5ML suspension 30 mL  30 mL Oral Q4H PRN Hildred Priest, MD   30 mL at 10/17/16 0548  . buprenorphine-naloxone (SUBOXONE) 8-2 mg per SL tablet 1 tablet  1 tablet Sublingual Daily Hildred Priest, MD   1 tablet at 10/22/16 0808  . chlordiazePOXIDE (LIBRIUM) capsule 25 mg  25 mg Oral TID Lenward Chancellor, MD   25 mg at 10/22/16 1308  . diphenhydrAMINE (BENADRYL) capsule 50 mg  50 mg Oral QHS Hildred Priest, MD   50 mg at 10/21/16 2116  . elvitegravir-cobicistat-emtricitabine-tenofovir (GENVOYA) 150-150-200-10 MG tablet 1 tablet  1 tablet Oral Daily Hildred Priest, MD   1 tablet at 10/22/16 0809  . estradiol (ESTRACE) tablet 0.5 mg  0.5 mg Oral Daily Hildred Priest, MD   0.5 mg at 10/22/16 0808  . feeding supplement (ENSURE ENLIVE) (ENSURE ENLIVE) liquid 237 mL  237 mL Oral TID BM Hildred Priest, MD   237 mL at 10/22/16 1000  . fluticasone (FLONASE) 50 MCG/ACT  nasal spray 2 spray  2 spray Each Nare Daily Hildred Priest, MD   2 spray at 10/22/16 0809  . ibuprofen (ADVIL,MOTRIN) tablet 800 mg  800 mg Oral Q6H PRN Lenward Chancellor, MD   800 mg at 10/22/16 1543  . loratadine (CLARITIN) tablet 10 mg  10 mg Oral Daily Hildred Priest, MD   10 mg at 10/22/16 7628  . magnesium hydroxide (MILK OF MAGNESIA) suspension 30 mL  30 mL Oral Daily PRN Hildred Priest, MD   30 mL at 10/22/16 1007  . nicotine (NICODERM CQ - dosed in mg/24 hours) patch 21 mg  21 mg Transdermal Daily Hildred Priest, MD   21 mg at 10/22/16 0808  . OLANZapine (ZYPREXA) tablet 2.5 mg  2.5 mg Oral QHS Hildred Priest, MD   2.5 mg at 10/21/16 2116  . ondansetron (ZOFRAN-ODT) disintegrating tablet 4 mg  4 mg Oral Q8H PRN Hildred Priest, MD   4 mg at 10/21/16 2119  . pantoprazole (PROTONIX) EC tablet 40 mg  40 mg Oral Daily Hildred Priest, MD   40 mg at 10/22/16 3151  . polyethylene glycol (MIRALAX / GLYCOLAX) packet 17 g  17 g Oral BID Hildred Priest, MD   17 g at 10/22/16 1543  . prazosin (MINIPRESS) capsule 1 mg  1 mg Oral QHS Hildred Priest, MD   1 mg at 10/21/16 2116   PTA Medications: Prescriptions Prior to Admission  Medication Sig Dispense Refill Last Dose  . elvitegravir-cobicistat-emtricitabine-tenofovir (GENVOYA) 150-150-200-10 MG TABS tablet Take 1 tablet by mouth daily. 28 tablet 0     Patient Stressors: Arts development officer issue Substance abuse Traumatic  event  Patient Strengths: Ability for insight Capable of independent living General fund of knowledge Physical Health Supportive family/friends  Treatment Modalities: Medication Management, Group therapy, Case management,  1 to 1 session with clinician, Psychoeducation, Recreational therapy.   Physician Treatment Plan for Primary Diagnosis: MDD (major depressive disorder), recurrent severe, without psychosis  ([place]) Long Term Goal(s): Improvement in symptoms so as ready for discharge Improvement in symptoms so as ready for discharge   Short Term Goals: Ability to identify changes in lifestyle to reduce recurrence of condition will improve Ability to demonstrate self-control will improve Ability to identify and develop effective coping behaviors will improve Ability to identify triggers associated with substance abuse/mental health issues will improve Ability to verbalize feelings will improve Ability to disclose and discuss suicidal ideas  Medication Management: Evaluate patient's response, side effects, and tolerance of medication regimen.  Therapeutic Interventions: 1 to 1 sessions, Unit Group sessions and Medication administration.  Evaluation of Outcomes: Adequate for Discharge  Physician Treatment Plan for Secondary Diagnosis: Principal Problem:   MDD (major depressive disorder), recurrent severe, without psychosis ([place]) Active Problems:   Borderline personality disorder   PTSD (post-traumatic stress disorder)   Pancreatitis   Alcohol use disorder, severe, dependence ([facility])   Alcohol withdrawal ([place])   Opioid use disorder, severe, dependence ([place])   Opioid use with withdrawal ([place])   Tobacco use disorder   Cocaine use disorder, severe, dependence ([place])  Long Term Goal(s): Improvement in symptoms so as ready for discharge Improvement in symptoms so as ready for discharge   Short Term Goals: Ability to identify changes in lifestyle to reduce recurrence of condition will improve Ability to demonstrate self-control will improve Ability to identify and develop effective coping behaviors will improve Ability to identify triggers associated with substance abuse/mental health issues will improve Ability to verbalize feelings will improve Ability to disclose and discuss suicidal ideas     Medication Management: Evaluate patient's response, side effects, and tolerance of medication  regimen.  Therapeutic Interventions: 1 to 1 sessions, Unit Group sessions and Medication administration.  Evaluation of Outcomes: Adequate for Discharge   RN Treatment Plan for Primary Diagnosis: MDD (major depressive disorder), recurrent severe, without psychosis ([place]) Long Term Goal(s): Knowledge of disease and therapeutic regimen to maintain health will improve  Short Term Goals: Ability to demonstrate self-control and Ability to participate in decision making will improve  Medication Management: RN will administer medications as ordered by provider, will assess and evaluate patient's response and provide education to patient for prescribed medication. RN will report any adverse and/or side effects to prescribing provider.  Therapeutic Interventions: 1 on 1 counseling sessions, Psychoeducation, Medication administration, Evaluate responses to treatment, Monitor vital signs and CBGs as ordered, Perform/monitor CIWA, COWS, AIMS and Fall Risk screenings as ordered, Perform wound care treatments as ordered.  Evaluation of Outcomes: Adequate for Discharge   LCSW Treatment Plan for Primary Diagnosis: MDD (major depressive disorder), recurrent severe, without psychosis ([place]) Long Term Goal(s): Safe transition to appropriate next level of care at discharge, Engage patient in therapeutic group addressing interpersonal concerns.  Short Term Goals: Engage patient in aftercare planning with referrals and resources, Facilitate patient progression through stages of change regarding substance use diagnoses and concerns and Increase skills for wellness and recovery  Therapeutic Interventions: Assess for all discharge needs, 1 to 1 time with Social worker, Explore available resources and support systems, Assess for adequacy in community support network, Educate family and significant other(s) on suicide prevention, Complete Psychosocial Assessment, Interpersonal  group therapy.  Evaluation of Outcomes:  Adequate for Discharge    Recreational Therapy Treatment Plan for Primary Diagnosis: MDD (major depressive disorder), recurrent severe, without psychosis ([place]) Long Term Goal(s): Patient will participate in recreation therapy treatment in at least 2 group sessions without prompting from LRT  Short Term Goals: Increase self-esteem, Increase healthy coping skills  Treatment Modalities: Group Therapy and Individual Treatment Sessions  Therapeutic Interventions: Psychoeducation  Evaluation of Outcomes: Adequate for Discharge   Progress in Treatment: Attending groups: Yes. Participating in groups: Yes. Taking medication as prescribed: Yes. Toleration medication: Yes. Family/Significant other contact made: No.  Contact attempts made. Patient understands diagnosis: Yes. Discussing patient identified problems/goals with staff: Yes. Medical problems stabilized or resolved: Yes. Denies suicidal/homicidal ideation: Yes. Issues/concerns per patient self-inventory: No. Other: none  New problem(s) identified: No, Describe:  none  New Short Term/Long Term Goal(s): Pt goal: "I need to go to rehab and to learn how to stay calm."  Discharge Plan or Barriers: Pt is on wait list for residential treatment and will follow up with outpatient services until bed becomes available.  Reason for Continuation of Hospitalization: Depression Medication stabilization  Estimated Length of Stay:1 day  Attendees: Patient:Carrie Phillips 10/22/2016   Physician: Dr. Jerilee Hoh, MD 10/22/2016   Nursing: Floyde Parkins, RN 10/22/2016   RN Care Manager: 10/22/2016   Social Worker: Lurline Idol, LCSW 10/22/2016   Recreational Therapist: Everitt Amber, LRT/CTRS  10/22/2016   Other:  10/22/2016   Other:  10/22/2016   Other: 10/22/2016           Scribe for Treatment Team: Joanne Chars, LCSW 10/22/2016 4:25 PM

## 2016-10-22 NOTE — Plan of Care (Signed)
Problem: [facility] Participation in Recreation Therapeutic Interventions Goal: STG-Patient will demonstrate improved self esteem by identif STG: Self-Esteem - Within 4 treatment sessions, patient will verbalize at least 5 positive affirmation statements in each of 2 treatment sessions to increase self-esteem.  Outcome: Progressing Treatment Session 1; Completed 1 out of 2: At approximately 2:05 pm, LRT met with patient in craft room. Patient verbalized 5 positive affirmation statements. When asked how it felt, patient stated, "Just said words." LRT encouraged patient to continue saying positive affirmation statements.  Leonette Monarch, LRT/CTRS 04.25.18 3:43 pm Goal: STG-Patient will identify at least five coping skills for ** STG: Coping Skills - Within 4 treatment sessions, patient will verbalize at least 5 coping skills for substance abuse in each of 2 treatment sessions to decrease substance abuse.  Outcome: Not Applicable Date Met: 63/84/53 At approximately 2:05 pm, LRT met with patient in craft room. Patient reported she had coping skills and did not need any more.  Leonette Monarch, LRT/CTRS 04.25.18 3:44 pm

## 2016-10-22 NOTE — Care Management (Signed)
RNCM consult received to assist with nPEP and follow up appointment. Patient does not have insurance. Access Program Voucher application was sent on 10/13/16 per previous CM note; this RNCM has called Philis Fendt [phone] for voucher number (message left for them to call back this RNCM with voucher number).  Voucher number will be shared with appropriate members for financial  reimbursement. Five day supply of Genvoya through [facility] inpatient pharmacy to be delivered to patient prior to discharge tomorrow (by [facility] pharmacy tech).  The remaining dose has been e-scripted to [facility] (per MD) to be mailed to patient's address as patient plans to return to [facility]. Patient may choose to follow up with health department for STD labs check. I have also emailed Bard Herbert NP, to see if her outpatient clinic can assist patient.

## 2016-10-22 NOTE — BHH Group Notes (Signed)
[facility] Group Notes:  (Nursing/MHT/Case Management/Adjunct)  Date:  10/22/2016  Time:  1:28 AM  Type of Therapy:  Evening Wrap-up Group  Participation Level:  Minimal  Participation Quality:  Attentive  Affect:  Appropriate  Cognitive:  Appropriate  Insight:  Good  Engagement in Group:  Developing/Improving and Engaged  Modes of Intervention:  Discussion  Summary of Progress/Problems:  [employer] 10/22/2016, 1:28 AM

## 2016-10-22 NOTE — BHH Group Notes (Signed)
[place] LCSW Group Therapy  10/22/2016 1:00 PM  Type of Therapy:  Group Therapy  Participation Level:  Active  Participation Quality:  Monopolizing and Redirectable  Affect:  Anxious and Excited  Cognitive:  Appropriate  Insight:  Developing/Improving  Engagement in Therapy:  Developing/Improving  Modes of Intervention:  Discussion, Exploration, Problem-solving and Socialization   Emotion Regulation: This group focused on both positive and negative emotion identification and allowed group members to process ways to identify feelings, regulate negative emotions, and find healthy ways to manage internal/external emotions. Group members were asked to reflect on a time when their reaction to an emotion led to a negative outcome and explored how alternative responses using emotion regulation would have benefited them. Group members were also asked to discuss a time when emotion regulation was utilized when a negative emotion was experienced.    Summary of Progress/Problems:  Patient had some insight into her tendency to dominate conversations and responded well to requests by peers to limit her comments.  Was able to ask peer in group to stop talking about "drugs", which she identifies as a trigger for her.  Identified anger as her primary emotion, states she is aware she does "things that I dont mean to do" when angry and is working on more control.    Beverely Pace 10/22/2016, 2:23 PM

## 2016-10-22 NOTE — Progress Notes (Signed)
CSW is pursuing medication assisted treatment options for pt: ADS: two phone messages left, no response yet. [facility] treatment: $25 per day, not really set up for people with no resources, per staff member. [facility]: next appt would be in July. Winferd Humphrey, MSW, LCSW Clinical Social Worker 10/22/2016 8:40 AM

## 2016-10-22 NOTE — Progress Notes (Signed)
Pt to nurse's station complaining that "I'm going to be discharged tomorrow and I won't be able to get my pills until next Wednesday! That's 6 days without opiates, and I know I'm going to start withdrawing and want to drink. I just can't do it. Earlie Server are just setting me up for failure." Support and encouragement provided. Pt informed that she would have to have the want/desire to NOT use opiates or alcohol when discharged, use is up to her. Encouraged use of coping skills, which pt would not verbalize she uses. Pt uncooperative with conversation, fixated on "I'm going to use when I leave..." Safety maintained with every 15 minute checks. Will continue to monitor.
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
## 2016-10-22 NOTE — Progress Notes (Signed)
Pt up complaining that she wants to leave and no one understands what she is going through. Pt was reminded that just a few days ago she stated "If ya'll discharge me I'm going to fucking kill myself and that's going to be on ya'll". Pt stated she said that because we did not have a discharge plan in place for her, writer tried to inform pt that the discharge plan was her responsibility and we were here to assist pt with that not do it for her.

## 2016-10-22 NOTE — Progress Notes (Signed)
Pt got up stating "I can't fucking sleep, I'm still having nightmares". Pt got up irritable wanting to leave , wanting any medication she had available. Pt was given Tylenol and Milk of mag, per [facility]

## 2016-10-23 MED FILL — GENVOYA TABLET: 150-150-200 | 14 days supply | Qty: 14 | Fill #0

## 2016-10-23 NOTE — Progress Notes (Signed)
D: Pt denies SI/HI/AVH. Pt is irritable and angry towards staff, she stated" l  Have been treated bad since been here" Patient was give support and encouragement.  Patient also stated that she was worried about her not getting her suboxone from the methadone clinic she was referred to until next week because they only writ e methadone prescription on Wednesday. Patient was noted to be very anxious and restless he appears worried. MD on call paged and writer explained patient's fears to her. MD explained she could not write her a  Prescription and that an order written for tramadol. Patient appeared to feel better after she writer explained the situation and she is interacting  with peers and staff appropriately.  A: Pt was offered support and encouragement. Pt was given scheduled medications. Pt was encouraged to attend groups. Q 15 minute checks were done for safety.  R:Pt attends groups and interacts well with peers and staff. Pt is taking medication. Pt has no complaints.Pt receptive to treatment and safety maintained on unit.

## 2016-10-23 NOTE — Progress Notes (Signed)
Recreation Therapy Notes  INPATIENT RECREATION TR PLAN  Patient Details Name: Carrie Phillips MRN: [id number] DOB: 11/01/1980 Today's Date: 10/23/2016  Rec Therapy Plan Is patient appropriate for Therapeutic Recreation?: Yes Treatment times per week: At least once a week TR Treatment/Interventions: 1:1 session, Group participation (Comment) (Appropriate participation in daily recreational therapy tx)  Discharge Criteria Pt will be discharged from therapy if:: Treatment goals are met, Discharged Treatment plan/goals/alternatives discussed and agreed upon by:: Patient/family  Discharge Summary Short term goals set: See Care Plan Short term goals met: Complete Progress toward goals comments: One-to-one attended Which groups?: Leisure education, Goal setting, Other (Comment) (Self-expression) One-to-one attended: Self-esteem Reason goals not met: N/A Therapeutic equipment acquired: None Reason patient discharged from therapy: Discharge from hospital Pt/family agrees with progress & goals achieved: Yes Date patient discharged from therapy: 10/23/16   , M, LRT/CTRS 10/23/2016, 3:08 PM  

## 2016-10-23 NOTE — Plan of Care (Signed)
Problem: [facility] Participation in Recreation Therapeutic Interventions Goal: STG-Patient will demonstrate improved self esteem by identif STG: Self-Esteem - Within 4 treatment sessions, patient will verbalize at least 5 positive affirmation statements in each of 2 treatment sessions to increase self-esteem.  Outcome: Completed/Met Date Met: 10/23/16 Treatment Session 2; Completed 2 out of 2: At approximately 8:45 am, LRT met with patient in consultation room. Patient verbalized 5 positive affirmation statements. Patient reported it felt "good". LRT encouraged patient to continue saying positive affirmation statements.  Leonette Monarch, LRT/CTRS 04.26.18 9:22 am

## 2016-10-23 NOTE — Care Management (Signed)
RNCM called back to [facility] to confirm address with nursing staff and was told patient was already gone (as in discharged).  No other RNCM needs.

## 2016-10-23 NOTE — Progress Notes (Signed)
  [facility] Adult Case Management Discharge Plan :  Will you be returning to the same living situation after discharge:  No. Pt will be staying with friends. At discharge, do you have transportation home?: No. Bus ticket provided to [place]. Do you have the ability to pay for your medications: No. Pt directed to [place].  Release of information consent forms completed and in the chart;  Patient's signature needed at discharge.  Patient to Follow up at: Follow-up Information    [facility].   Specialty:  Behavioral Health Why:  Please attend your follow up appointment at [facility] on 10/23/16 at 10am with Susanne Greenhouse.  Please bring your hospital discharge paperwork and photo ID. Contact information: [address] [place] [address] [phone]           Next level of care provider has access to [place] and Suicide Prevention discussed: No. Contact attempts made.  Have you used any form of tobacco in the last 30 days? (Cigarettes, Smokeless Tobacco, Cigars, and/or Pipes): Yes  Has patient been referred to the Quitline?: Patient refused referral  Patient has been referred for addiction treatment: Yes  Joanne Chars, [place] 10/23/2016, 8:26 AM

## 2016-10-23 NOTE — Care Management (Addendum)
[employer] never returned my call with voucher number. I have called [employer] [phone] again and spoke to [place]; VOUCHER [phone]  [place] PCN [id number] GRP [id number].  I have also asked Mendel Ryder for [facility] Rossi's clinic contact information as I have left voice mail on [facility] [employer] Contact. Shelda Altes with [facility] pharmacy returned my email and they did receive the remaining order for patient's Genvoya to mail to address listed on facesheet.

## 2016-10-23 NOTE — Plan of Care (Signed)
Problem: Education: Goal: Knowledge of the prescribed therapeutic regimen will improve Outcome: Progressing Patient has knowledge of prescribed medicine.

## 2016-10-23 NOTE — Progress Notes (Signed)
Patient is loud, demanding and hyper verbal.  Denies SI/HI/AVH. Discharge instructions given.  Verbalized understanding.  Prescriptions and seven day supply of medications given.  Personal belongings returned.   Escorted off unit by this Probation officer to doctors on call to meet security to be transported to the bus stop.

## 2016-10-28 ENCOUNTER — Encounter (HOSPITAL_COMMUNITY): Payer: Self-pay

## 2016-10-28 LAB — COMPREHENSIVE METABOLIC PANEL
ALT: 29 U/L (ref 14–54)
AST: 29 U/L (ref 15–41)
Albumin: 4 g/dL (ref 3.5–5.0)
Alkaline Phosphatase: 87 U/L (ref 38–126)
Anion gap: 9 (ref 5–15)
BUN: 9 mg/dL (ref 6–20)
CHLORIDE: 99 mmol/L — AB (ref 101–111)
CO2: 26 mmol/L (ref 22–32)
Calcium: 9.1 mg/dL (ref 8.9–10.3)
Creatinine, Ser: 0.64 mg/dL (ref 0.44–1.00)
Glucose, Bld: 104 mg/dL — ABNORMAL HIGH (ref 65–99)
POTASSIUM: 3.5 mmol/L (ref 3.5–5.1)
Sodium: 134 mmol/L — ABNORMAL LOW (ref 135–145)
Total Bilirubin: 0.4 mg/dL (ref 0.3–1.2)
Total Protein: 7.3 g/dL (ref 6.5–8.1)

## 2016-10-28 LAB — CBC
HEMATOCRIT: 38.4 % (ref 36.0–46.0)
Hemoglobin: 13.2 g/dL (ref 12.0–15.0)
MCH: 32.7 pg (ref 26.0–34.0)
MCHC: 34.4 g/dL (ref 30.0–36.0)
MCV: 95 fL (ref 78.0–100.0)
Platelets: 308 10*3/uL (ref 150–400)
RBC: 4.04 MIL/uL (ref 3.87–5.11)
RDW: 14.1 % (ref 11.5–15.5)
WBC: 6.1 10*3/uL (ref 4.0–10.5)

## 2016-10-28 LAB — ACETAMINOPHEN LEVEL

## 2016-10-28 LAB — SALICYLATE LEVEL: Salicylate Lvl: <7 mg/dL (ref 2.8–30.0)

## 2016-10-28 LAB — ETHANOL: Alcohol, Ethyl (B): <5 mg/dL (ref <5)
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
## 2016-10-29 ENCOUNTER — Emergency Department (HOSPITAL_COMMUNITY)
Admission: EM | Admit: 2016-10-29 | Discharge: 2016-10-29 | Disposition: A | Discharge status: Home / Self Care | Payer: Self-pay | Attending: Emergency Medicine | Admitting: Emergency Medicine

## 2016-10-29 DIAGNOSIS — F603 Borderline personality disorder: Secondary | ICD-10-CM | POA: Insufficient documentation

## 2016-10-29 DIAGNOSIS — F1420 Cocaine dependence, uncomplicated: Secondary | ICD-10-CM | POA: Diagnosis present

## 2016-10-29 DIAGNOSIS — F1910 Other psychoactive substance abuse, uncomplicated: Secondary | ICD-10-CM | POA: Insufficient documentation

## 2016-10-29 DIAGNOSIS — F1020 Alcohol dependence, uncomplicated: Secondary | ICD-10-CM | POA: Diagnosis present

## 2016-10-29 DIAGNOSIS — F17210 Nicotine dependence, cigarettes, uncomplicated: Secondary | ICD-10-CM | POA: Insufficient documentation

## 2016-10-29 DIAGNOSIS — F1510 Other stimulant abuse, uncomplicated: Secondary | ICD-10-CM | POA: Insufficient documentation

## 2016-10-29 DIAGNOSIS — F1120 Opioid dependence, uncomplicated: Secondary | ICD-10-CM | POA: Diagnosis present

## 2016-10-29 DIAGNOSIS — F1410 Cocaine abuse, uncomplicated: Secondary | ICD-10-CM | POA: Insufficient documentation

## 2016-10-29 LAB — RAPID URINE DRUG SCREEN, HOSP PERFORMED
AMPHETAMINES: POSITIVE — AB
Barbiturates: NOT DETECTED
Benzodiazepines: POSITIVE — AB
Cocaine: POSITIVE — AB
OPIATES: NOT DETECTED
Tetrahydrocannabinol: POSITIVE — AB

## 2016-10-29 MED ORDER — PRAZOSIN HCL 1 MG PO CAPS: 1.0000 mg | ORAL_CAPSULE | Freq: Every day | ORAL | Status: DC

## 2016-10-29 MED ORDER — TRAMADOL HCL 50 MG PO TABS
50.0000 mg | ORAL_TABLET | Freq: Three times a day (TID) | ORAL | Status: DC
  Administered 2016-10-29: 50 mg via ORAL
  Filled 2016-10-29: qty 1

## 2016-10-29 MED ORDER — LORAZEPAM 1 MG PO TABS: 0.0000 mg | ORAL_TABLET | Freq: Two times a day (BID) | ORAL | Status: DC

## 2016-10-29 MED ORDER — ESTRADIOL 1 MG PO TABS
0.5000 mg | ORAL_TABLET | Freq: Every day | ORAL | Status: DC
  Administered 2016-10-29: 0.5 mg via ORAL
  Filled 2016-10-29: qty 0.5

## 2016-10-29 MED ORDER — ONDANSETRON 4 MG PO TBDP
4.0000 mg | ORAL_TABLET | Freq: Three times a day (TID) | ORAL | Status: DC | PRN
  Administered 2016-10-29: 4 mg via ORAL
  Filled 2016-10-29: qty 1

## 2016-10-29 MED ORDER — HYDROCORTISONE 1 % EX LOTN
TOPICAL_LOTION | Freq: Three times a day (TID) | CUTANEOUS | Status: DC
  Administered 2016-10-29: 11:00:00 via TOPICAL
  Filled 2016-10-29: qty 118

## 2016-10-29 MED ORDER — CEFTRIAXONE SODIUM 250 MG IJ SOLR
250.0000 mg | Freq: Once | INTRAMUSCULAR | Status: AC
  Administered 2016-10-29: 250 mg via INTRAMUSCULAR
  Filled 2016-10-29: qty 250

## 2016-10-29 MED ORDER — LORAZEPAM 1 MG PO TABS: 0.0000 mg | ORAL_TABLET | Freq: Four times a day (QID) | ORAL | Status: DC

## 2016-10-29 MED ORDER — LIDOCAINE HCL 1 % IJ SOLN
INTRAMUSCULAR | Status: AC
  Administered 2016-10-29: 0.9 mL
  Filled 2016-10-29: qty 20

## 2016-10-29 MED ORDER — OLANZAPINE 2.5 MG PO TABS: 2.5000 mg | ORAL_TABLET | Freq: Every day | ORAL | Status: DC

## 2016-10-29 MED ORDER — ELVITEG-COBIC-EMTRICIT-TENOFAF 150-150-200-10 MG PO TABS
1.0000 | ORAL_TABLET | Freq: Every day | ORAL | Status: DC
  Administered 2016-10-29: 1 via ORAL
  Filled 2016-10-29: qty 1

## 2016-10-29 MED ORDER — HYDROXYZINE HCL 25 MG PO TABS
25.0000 mg | ORAL_TABLET | Freq: Four times a day (QID) | ORAL | Status: DC | PRN
  Administered 2016-10-29: 25 mg via ORAL
  Filled 2016-10-29: qty 1

## 2016-10-29 MED ORDER — LORAZEPAM 1 MG PO TABS
0.0000 mg | ORAL_TABLET | Freq: Four times a day (QID) | ORAL | Status: DC
  Administered 2016-10-29: 2 mg via ORAL
  Filled 2016-10-29: qty 2

## 2016-10-29 MED ORDER — AZITHROMYCIN 250 MG PO TABS
1000.0000 mg | ORAL_TABLET | Freq: Once | ORAL | Status: AC
  Administered 2016-10-29: 1000 mg via ORAL
  Filled 2016-10-29: qty 4

## 2016-10-29 NOTE — ED Notes (Signed)
Bed: WTR5 Expected date:  Expected time:  Means of arrival:  Comments: 

## 2016-10-29 NOTE — ED Notes (Signed)
[phone]- [place]- pt's mom in [facility]
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
## 2016-10-29 NOTE — Progress Notes (Signed)
Attempted to contact pt. Mother per requested could notify of being in hospital, But "[place] [place]" voice mail answered, and could not verify this is a point of contact. Did not leave message. Tamarcus Condie K. Nash Shearer, LPC-A, [facility]  Counselor 10/29/2016 4:36 AM

## 2016-10-29 NOTE — Progress Notes (Signed)
Per [place] Ouch [facility] RN [facility]  No current appropriate beds available Athaliah Baumbach K. Nash Shearer, LPC-A, [facility]  Counselor 10/29/2016 4:36 AM

## 2016-10-29 NOTE — BHH Counselor (Signed)
Patient referral submitted to: [facility]

## 2016-10-29 NOTE — ED Notes (Signed)
Pt discharged ambulatory.  Discharge instructions were given and multiple resources were given.  New clothes from the clothes closet were given .  Bus pass was given.  Pt was in no distress at discharge.

## 2016-10-29 NOTE — Progress Notes (Signed)
Patient referral submitted to: [facility], [place], [place], [place], [place], [place], [facility], [place], [facility], [place], [facility]  [place] K. Nash Shearer, LPC-A, [facility]  Counselor 10/29/2016 4:49 AM
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
## 2016-10-29 NOTE — Progress Notes (Signed)
CSW provided patient with list of shelters in the area, substance abuse information, and bus pass.   Kingsley Spittle, LCSWA Clinical Social Worker [phone]
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
## 2016-10-29 NOTE — Progress Notes (Signed)
Per Carrie Clan PA meets inpatient criteria Carrie Phillips. Nash Shearer, LPC-A, [facility]  Counselor 10/29/2016 4:31 AM

## 2016-10-29 NOTE — ED Notes (Signed)
PA Antonietta Breach at bedside to eval & speak with pt.
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
## 2016-10-29 NOTE — Progress Notes (Signed)
   10/29/16 1000  Clinical Encounter Type  Visited With Health care provider  Visit Type Follow-up;Psychological support;Spiritual support;Behavioral Health;ED  Referral From Nurse  Consult/Referral To Chaplain  Spiritual Encounters  Spiritual Needs Other (Comment) (Clothing )  Stress Factors  Patient Stress Factors Other (Comment) (Clothing)   I was called to the unit due to the patient requesting clothing. I had provided clothing to the patient during her last visit and they were soiled.  I provided the patient with 2 [place] of socks, 2 pairs of undergarments, 1 pair of pants, a sweatshirt, t-shirt and jacket.   Please, contact Spiritual Care for further assistance.   [place] M.Div.

## 2016-10-29 NOTE — Progress Notes (Signed)
CSW received call from [place] with [facility] stating patient has been declined due to primary substance abuse.   Kingsley Spittle, LCSWA Clinical Social Worker [phone]

## 2018-11-25 IMAGING — CT CT ABD-PELV W/ CM
2 of 4 series · 17 of 46 positions shown, 19 images · IV contrast (iopamidol)
Comparison: None.

CLINICAL DATA: Severe upper abdominal pain with nausea and vomiting

EXAM:
CT ABDOMEN AND PELVIS WITH CONTRAST
TECHNIQUE: Multidetector CT imaging of the abdomen and pelvis was performed
using the standard protocol following bolus administration of
intravenous contrast.
CONTRAST:  100mL C53WVA-PYY IOPAMIDOL (C53WVA-PYY) INJECTION 61%,
30mL C53WVA-PYY IOPAMIDOL (C53WVA-PYY) INJECTION 61%, 30mL
C53WVA-PYY IOPAMIDOL (C53WVA-PYY) INJECTION 61%

[Series 2: abd/pel with · axial · 0.65mm/px · z∈[+1307,+1677]mm · 14 of 84 slices shown, 16 images]
[im 5/84  soft-tissue]
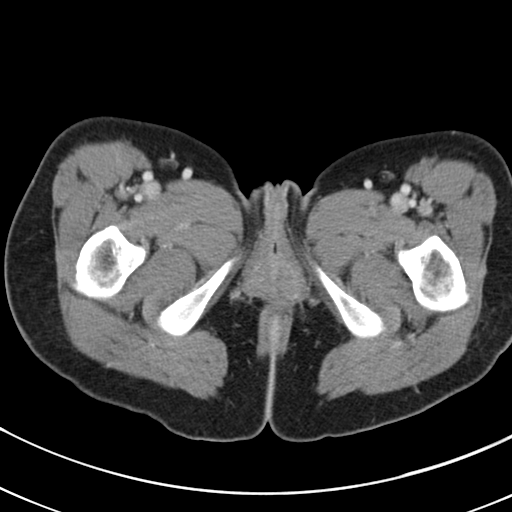
[im 5/84  bone]
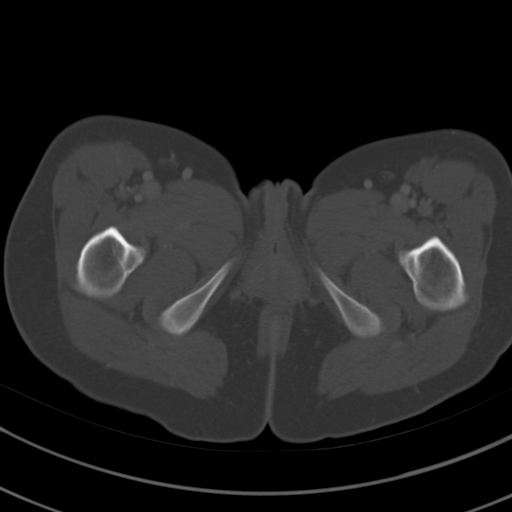
[im 13/84  soft-tissue]
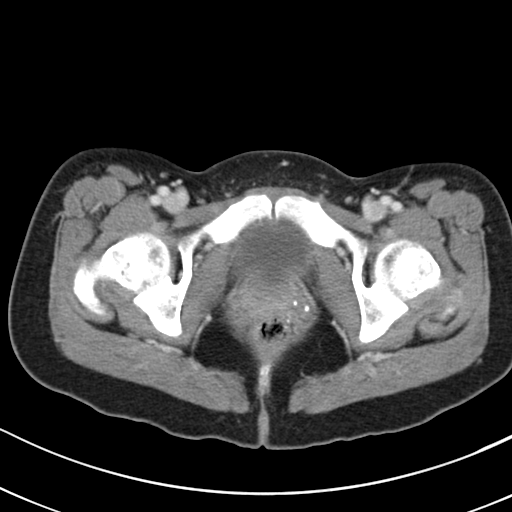
[im 17/84  soft-tissue]
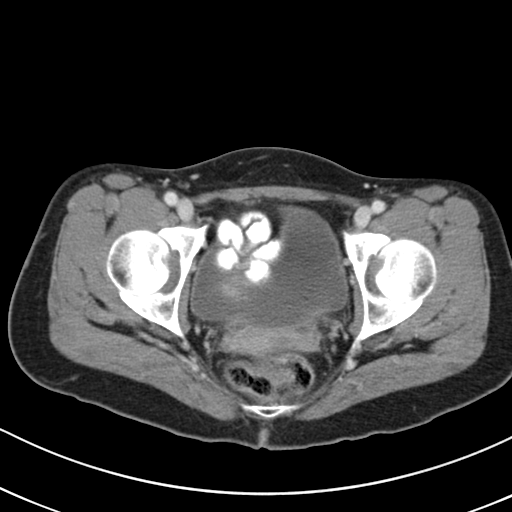
[im 21/84  soft-tissue]
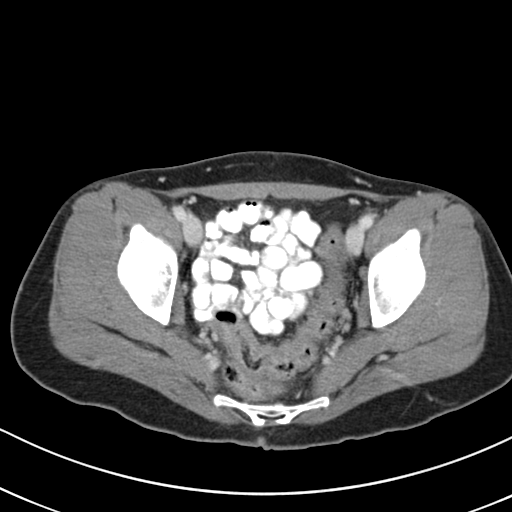
[im 30/84  soft-tissue]
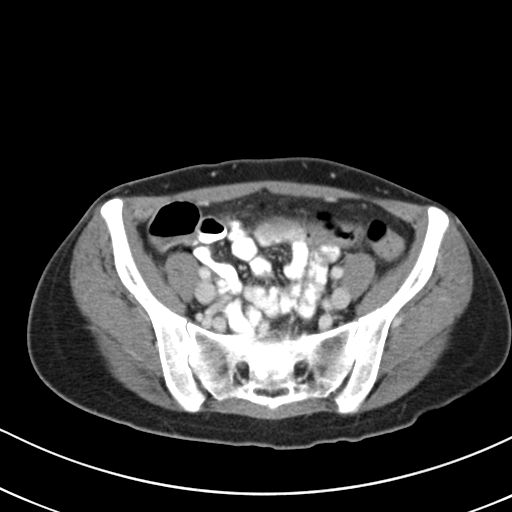
[im 34/84  soft-tissue]
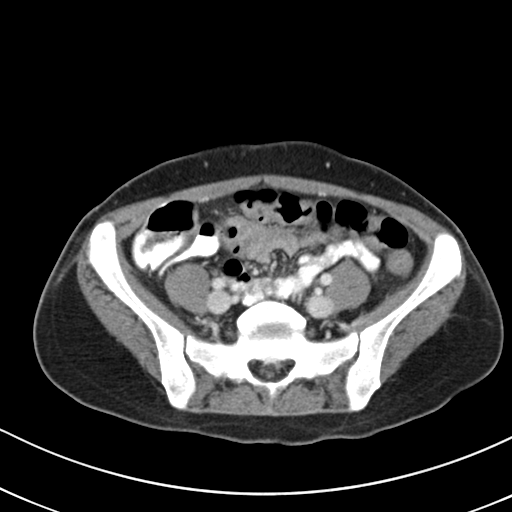
[im 38/84  soft-tissue]
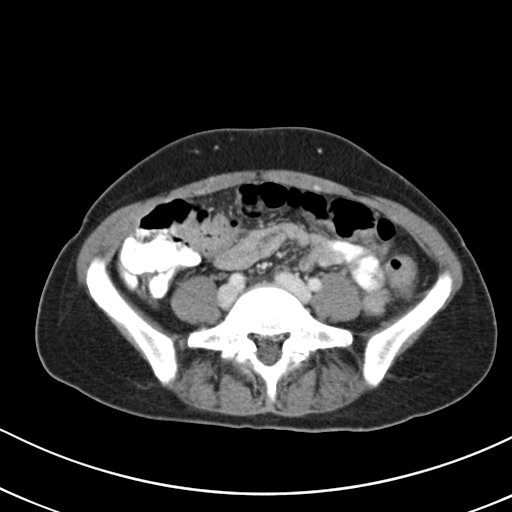
[im 46/84  soft-tissue]
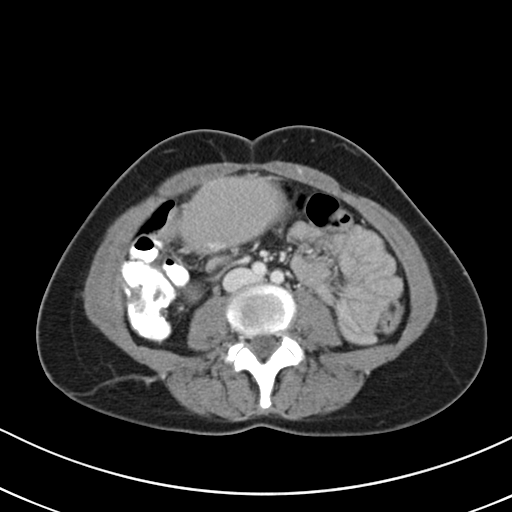
[im 50/84  soft-tissue]
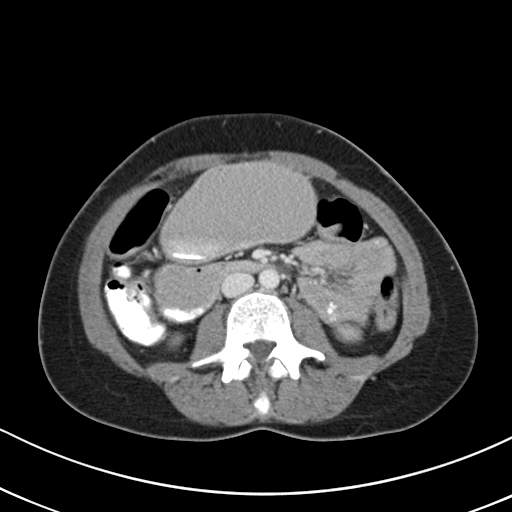
[im 50/84  bone]
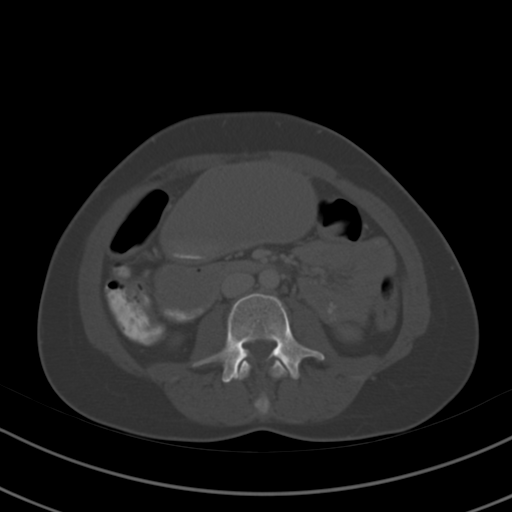
[im 54/84  soft-tissue]
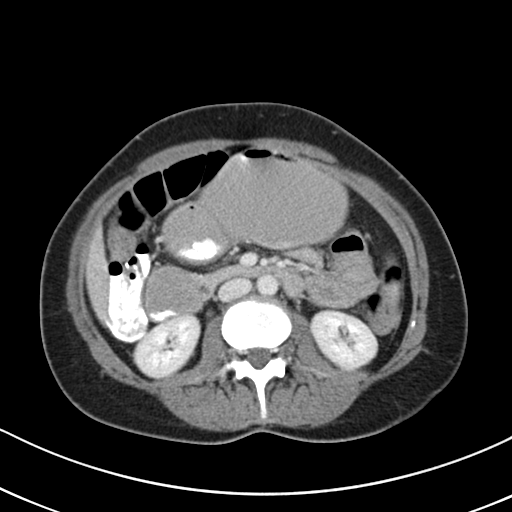
[im 63/84  soft-tissue]
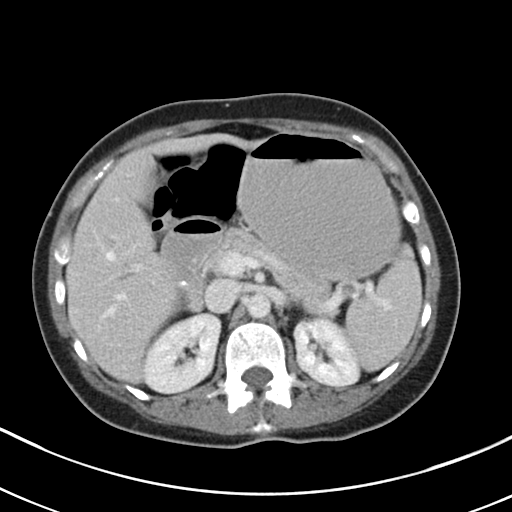
[im 67/84  soft-tissue]
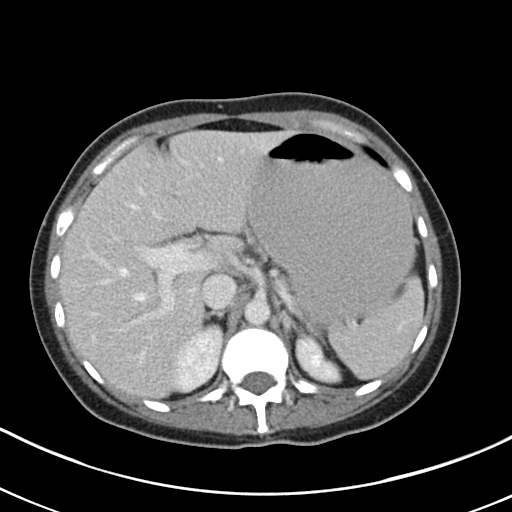
[im 71/84  soft-tissue]
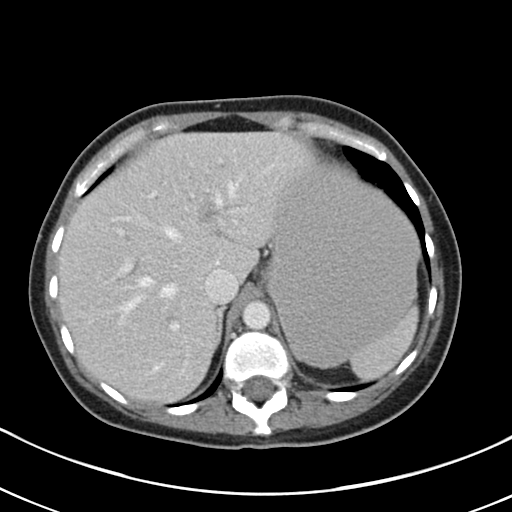
[im 79/84  soft-tissue]
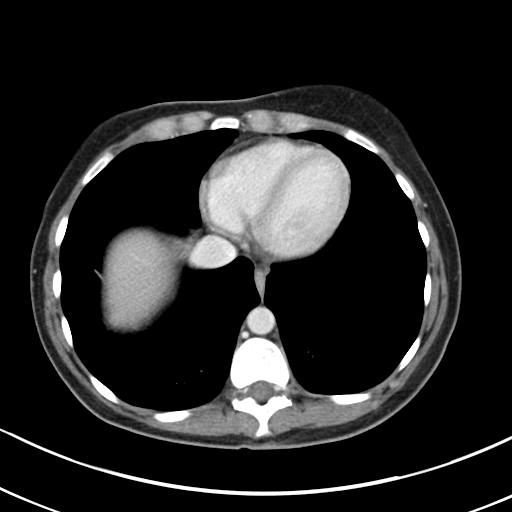

[Series 3: coronal a/|p · coronal · 0.86mm/px · 3 of 105 slices shown]
[im 35/105  soft-tissue]
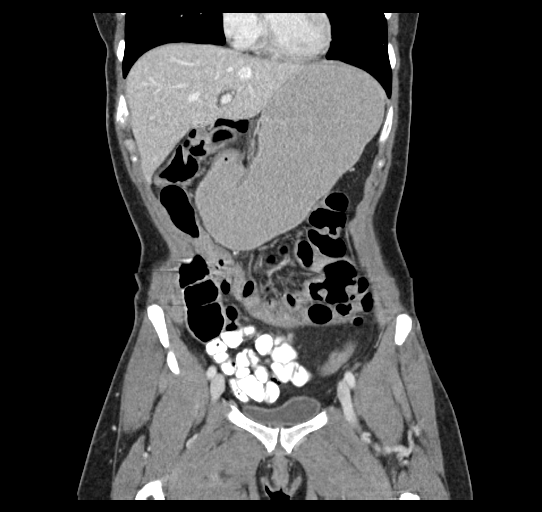
[im 47/105  soft-tissue]
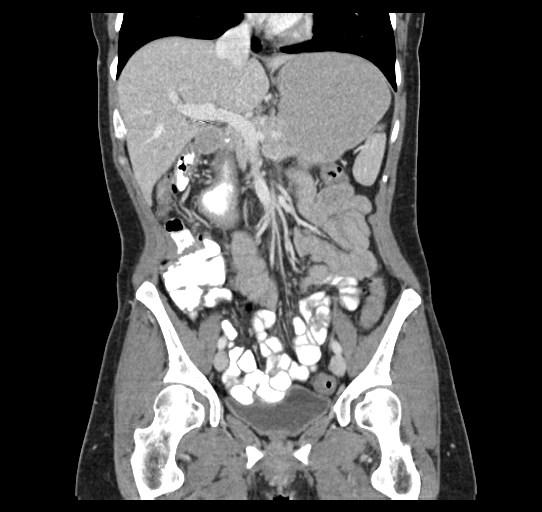
[im 58/105  soft-tissue]
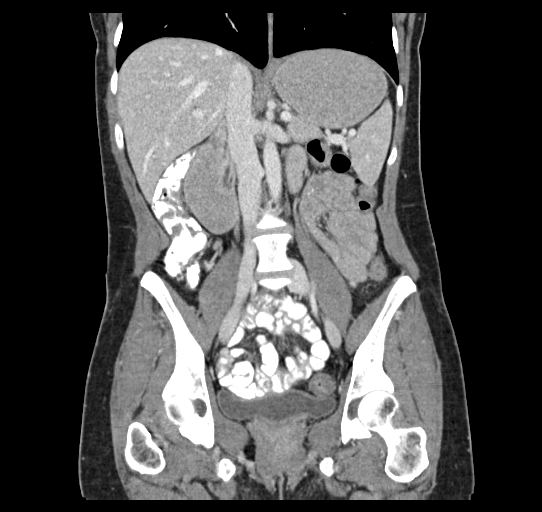

[17 of 46 positions shown; findings below may reference images not displayed]

FINDINGS: Lower chest: No acute abnormality.

Hepatobiliary: No focal liver abnormality is seen. Status post
cholecystectomy. No biliary dilatation.

Pancreas: Unremarkable. No pancreatic ductal dilatation or
surrounding inflammatory changes.

Spleen: Tiny nonspecific hypodensity anterior spleen.

Adrenals/Urinary Tract: Adrenal glands are unremarkable. Kidneys are
normal, without renal calculi, focal lesion, or hydronephrosis.
Bladder is unremarkable.

Stomach/Bowel: The stomach is nonenlarged. The appendix is normal.
There is marked redundancy of the colon. There is wall thickening of
the transverse colon and the descending and sigmoid colon.

Vascular/Lymphatic: No significant vascular findings are present. No
enlarged abdominal or pelvic lymph nodes.

Reproductive: No adnexal masses.  The uterus appears absent.

Other: No free air or free fluid.

Musculoskeletal: No acute osseous abnormality. Chronic pars defect
at L5
IMPRESSION: 1. Redundant colon with apparent wall thickening involving the
transverse, descending and sigmoid colon suspicious for colitis of
infectious or inflammatory etiology.
2. Status post cholecystectomy
3. There are no other acute abnormalities visualized.
